# Patient Record
Sex: Female | Born: 1970 | Race: White | Hispanic: No | Marital: Married | Smoking: Never smoker
Health system: Southern US, Community
[De-identification: ages and names within clinical notes are randomized; demographics above are authoritative.]

## PROBLEM LIST (undated history)

## (undated) DIAGNOSIS — I10 Essential (primary) hypertension: Secondary | ICD-10-CM

## (undated) DIAGNOSIS — E079 Disorder of thyroid, unspecified: Secondary | ICD-10-CM

## (undated) HISTORY — PX: OTHER SURGICAL HISTORY: SHX169

---

## 1997-12-18 ENCOUNTER — Other Ambulatory Visit: Admission: RE | Admit: 1997-12-18 | Discharge: 1997-12-18 | Payer: Self-pay | Admitting: Obstetrics & Gynecology

## 1998-12-21 ENCOUNTER — Other Ambulatory Visit: Admission: RE | Admit: 1998-12-21 | Discharge: 1998-12-21 | Payer: Self-pay | Admitting: Obstetrics & Gynecology

## 2001-03-25 ENCOUNTER — Other Ambulatory Visit: Admission: RE | Admit: 2001-03-25 | Discharge: 2001-03-25 | Payer: Self-pay | Admitting: Obstetrics & Gynecology

## 2004-09-26 ENCOUNTER — Encounter: Admission: RE | Admit: 2004-09-26 | Discharge: 2004-09-26 | Payer: Self-pay | Admitting: Obstetrics & Gynecology

## 2004-10-17 ENCOUNTER — Other Ambulatory Visit: Admission: RE | Admit: 2004-10-17 | Discharge: 2004-10-17 | Payer: Self-pay | Admitting: Obstetrics & Gynecology

## 2009-07-09 ENCOUNTER — Encounter: Admission: RE | Admit: 2009-07-09 | Discharge: 2009-07-09 | Payer: Self-pay | Admitting: Obstetrics & Gynecology

## 2010-09-23 ENCOUNTER — Encounter
Admission: RE | Admit: 2010-09-23 | Discharge: 2010-09-23 | Payer: Self-pay | Source: Home / Self Care | Attending: Obstetrics & Gynecology | Admitting: Obstetrics & Gynecology

## 2011-09-06 ENCOUNTER — Other Ambulatory Visit: Payer: Self-pay | Admitting: Endocrinology

## 2011-09-06 DIAGNOSIS — E0590 Thyrotoxicosis, unspecified without thyrotoxic crisis or storm: Secondary | ICD-10-CM

## 2011-09-13 ENCOUNTER — Encounter (HOSPITAL_COMMUNITY)
Admission: RE | Admit: 2011-09-13 | Discharge: 2011-09-13 | Disposition: A | Discharge status: Home / Self Care | Payer: BC Managed Care – PPO | Source: Ambulatory Visit | Attending: Endocrinology | Admitting: Endocrinology

## 2011-09-13 DIAGNOSIS — E0590 Thyrotoxicosis, unspecified without thyrotoxic crisis or storm: Secondary | ICD-10-CM

## 2011-09-14 ENCOUNTER — Encounter (HOSPITAL_COMMUNITY)
Admission: RE | Admit: 2011-09-14 | Discharge: 2011-09-14 | Disposition: A | Discharge status: Home / Self Care | Payer: BC Managed Care – PPO | Source: Ambulatory Visit | Attending: Endocrinology | Admitting: Endocrinology

## 2011-09-14 DIAGNOSIS — R946 Abnormal results of thyroid function studies: Secondary | ICD-10-CM | POA: Insufficient documentation

## 2011-09-14 DIAGNOSIS — E0590 Thyrotoxicosis, unspecified without thyrotoxic crisis or storm: Secondary | ICD-10-CM | POA: Insufficient documentation

## 2011-09-14 MED ORDER — SODIUM IODIDE I 131 CAPSULE
10.2000 | Freq: Once | INTRAVENOUS | Status: AC | PRN
  Administered 2011-09-13: 10.2 via ORAL

## 2011-09-18 ENCOUNTER — Other Ambulatory Visit: Payer: Self-pay | Admitting: Endocrinology

## 2011-09-18 DIAGNOSIS — E0590 Thyrotoxicosis, unspecified without thyrotoxic crisis or storm: Secondary | ICD-10-CM

## 2011-09-28 ENCOUNTER — Encounter (HOSPITAL_COMMUNITY)
Admission: RE | Admit: 2011-09-28 | Discharge: 2011-09-28 | Disposition: A | Discharge status: Home / Self Care | Payer: BC Managed Care – PPO | Source: Ambulatory Visit | Attending: Endocrinology | Admitting: Endocrinology

## 2011-09-28 DIAGNOSIS — E0590 Thyrotoxicosis, unspecified without thyrotoxic crisis or storm: Secondary | ICD-10-CM | POA: Insufficient documentation

## 2011-09-28 LAB — HCG, SERUM, QUALITATIVE: Preg, Serum: NEGATIVE

## 2011-09-28 MED ORDER — SODIUM IODIDE I 131 CAPSULE
14.6700 | Freq: Once | INTRAVENOUS | Status: AC | PRN
  Administered 2011-09-28: 14.67 via ORAL

## 2012-09-02 ENCOUNTER — Other Ambulatory Visit: Payer: Self-pay | Admitting: Family Medicine

## 2012-09-02 DIAGNOSIS — Z1231 Encounter for screening mammogram for malignant neoplasm of breast: Secondary | ICD-10-CM

## 2012-09-25 ENCOUNTER — Observation Stay (HOSPITAL_COMMUNITY): Payer: BC Managed Care – PPO

## 2012-09-25 ENCOUNTER — Inpatient Hospital Stay (HOSPITAL_COMMUNITY)
Admission: EM | Admit: 2012-09-25 | Discharge: 2012-09-29 | DRG: 168 | Disposition: A | Discharge status: Home / Self Care | Payer: BC Managed Care – PPO | Attending: Internal Medicine | Admitting: Internal Medicine

## 2012-09-25 ENCOUNTER — Emergency Department (HOSPITAL_COMMUNITY): Payer: BC Managed Care – PPO

## 2012-09-25 ENCOUNTER — Encounter (HOSPITAL_COMMUNITY): Payer: Self-pay | Admitting: *Deleted

## 2012-09-25 ENCOUNTER — Ambulatory Visit: Payer: BC Managed Care – PPO

## 2012-09-25 DIAGNOSIS — L03211 Cellulitis of face: Secondary | ICD-10-CM | POA: Diagnosis present

## 2012-09-25 DIAGNOSIS — R259 Unspecified abnormal involuntary movements: Secondary | ICD-10-CM | POA: Diagnosis present

## 2012-09-25 DIAGNOSIS — E876 Hypokalemia: Secondary | ICD-10-CM | POA: Diagnosis not present

## 2012-09-25 DIAGNOSIS — L0201 Cutaneous abscess of face: Secondary | ICD-10-CM | POA: Diagnosis present

## 2012-09-25 DIAGNOSIS — E890 Postprocedural hypothyroidism: Secondary | ICD-10-CM | POA: Diagnosis present

## 2012-09-25 DIAGNOSIS — E162 Hypoglycemia, unspecified: Secondary | ICD-10-CM | POA: Diagnosis not present

## 2012-09-25 DIAGNOSIS — E039 Hypothyroidism, unspecified: Secondary | ICD-10-CM

## 2012-09-25 DIAGNOSIS — K047 Periapical abscess without sinus: Principal | ICD-10-CM | POA: Diagnosis present

## 2012-09-25 DIAGNOSIS — I10 Essential (primary) hypertension: Secondary | ICD-10-CM | POA: Diagnosis present

## 2012-09-25 DIAGNOSIS — D649 Anemia, unspecified: Secondary | ICD-10-CM | POA: Diagnosis not present

## 2012-09-25 HISTORY — DX: Essential (primary) hypertension: I10

## 2012-09-25 HISTORY — DX: Disorder of thyroid, unspecified: E07.9

## 2012-09-25 LAB — CBC WITH DIFFERENTIAL/PLATELET
Basophils Relative: 1 % (ref 0–1)
HCT: 38.5 % (ref 36.0–46.0)
Hemoglobin: 13.2 g/dL (ref 12.0–15.0)
Lymphocytes Relative: 19 % (ref 12–46)
Lymphs Abs: 2 10*3/uL (ref 0.7–4.0)
MCHC: 34.3 g/dL (ref 30.0–36.0)
Monocytes Absolute: 1 10*3/uL (ref 0.1–1.0)
Monocytes Relative: 9 % (ref 3–12)
Neutro Abs: 7.8 10*3/uL — ABNORMAL HIGH (ref 1.7–7.7)
Neutrophils Relative %: 71 % (ref 43–77)
RBC: 4.4 MIL/uL (ref 3.87–5.11)

## 2012-09-25 LAB — COMPREHENSIVE METABOLIC PANEL
Albumin: 4.3 g/dL (ref 3.5–5.2)
Alkaline Phosphatase: 97 U/L (ref 39–117)
BUN: 7 mg/dL (ref 6–23)
CO2: 28 mEq/L (ref 19–32)
Chloride: 98 mEq/L (ref 96–112)
Creatinine, Ser: 0.65 mg/dL (ref 0.50–1.10)
GFR calc Af Amer: >90 mL/min (ref >90)
GFR calc non Af Amer: >90 mL/min (ref >90)
Glucose, Bld: 99 mg/dL (ref 70–99)
Potassium: 3.4 mEq/L — ABNORMAL LOW (ref 3.5–5.1)
Total Bilirubin: 0.5 mg/dL (ref 0.3–1.2)

## 2012-09-25 LAB — PREGNANCY, URINE: Preg Test, Ur: NEGATIVE

## 2012-09-25 MED ORDER — HYDROMORPHONE HCL PF 1 MG/ML IJ SOLN
1.0000 mg | INTRAMUSCULAR | Status: DC | PRN
  Administered 2012-09-25 – 2012-09-29 (×18): 1 mg via INTRAVENOUS
  Filled 2012-09-25 (×18): qty 1

## 2012-09-25 MED ORDER — AMPICILLIN-SULBACTAM SODIUM 3 (2-1) G IJ SOLR
3.0000 g | Freq: Once | INTRAMUSCULAR | Status: AC
  Administered 2012-09-25: 3 g via INTRAVENOUS
  Filled 2012-09-25: qty 3

## 2012-09-25 MED ORDER — MORPHINE SULFATE 4 MG/ML IJ SOLN
4.0000 mg | Freq: Once | INTRAMUSCULAR | Status: AC
  Administered 2012-09-25: 4 mg via INTRAVENOUS
  Filled 2012-09-25: qty 1

## 2012-09-25 MED ORDER — SODIUM CHLORIDE 0.9 % IV SOLN
INTRAVENOUS | Status: DC
  Administered 2012-09-25: 23:00:00 via INTRAVENOUS

## 2012-09-25 MED ORDER — IOHEXOL 300 MG/ML  SOLN
75.0000 mL | Freq: Once | INTRAMUSCULAR | Status: AC | PRN
  Administered 2012-09-25: 75 mL via INTRAVENOUS

## 2012-09-25 MED ORDER — HYDROMORPHONE HCL PF 1 MG/ML IJ SOLN
1.0000 mg | Freq: Once | INTRAMUSCULAR | Status: AC
  Administered 2012-09-25: 1 mg via INTRAVENOUS
  Filled 2012-09-25: qty 1

## 2012-09-25 MED ORDER — SODIUM CHLORIDE 0.9 % IV SOLN
3.0000 g | Freq: Four times a day (QID) | INTRAVENOUS | Status: DC
  Filled 2012-09-25 (×2): qty 3

## 2012-09-25 MED ORDER — ONDANSETRON HCL 4 MG/2ML IJ SOLN: 4.0000 mg | Freq: Four times a day (QID) | INTRAMUSCULAR | Status: DC | PRN

## 2012-09-25 MED ORDER — NEBIVOLOL HCL 5 MG PO TABS
5.0000 mg | ORAL_TABLET | Freq: Every day | ORAL | Status: DC
  Administered 2012-09-26 – 2012-09-29 (×4): 5 mg via ORAL
  Filled 2012-09-25 (×4): qty 1

## 2012-09-25 MED ORDER — ONDANSETRON HCL 4 MG PO TABS: 4.0000 mg | ORAL_TABLET | Freq: Four times a day (QID) | ORAL | Status: DC | PRN

## 2012-09-25 MED ORDER — SODIUM CHLORIDE 0.9 % IV SOLN
3.0000 g | Freq: Four times a day (QID) | INTRAVENOUS | Status: DC
  Administered 2012-09-26 – 2012-09-29 (×14): 3 g via INTRAVENOUS
  Filled 2012-09-25 (×18): qty 3

## 2012-09-25 MED ORDER — LEVOTHYROXINE SODIUM 150 MCG PO TABS
150.0000 ug | ORAL_TABLET | Freq: Every day | ORAL | Status: DC
  Administered 2012-09-26 – 2012-09-29 (×4): 150 ug via ORAL
  Filled 2012-09-25 (×5): qty 1

## 2012-09-25 MED ORDER — VANCOMYCIN HCL IN DEXTROSE 1-5 GM/200ML-% IV SOLN: 1000.0000 mg | Freq: Once | INTRAVENOUS | Status: DC

## 2012-09-25 MED ORDER — ACETAMINOPHEN 650 MG RE SUPP: 650.0000 mg | Freq: Four times a day (QID) | RECTAL | Status: DC | PRN

## 2012-09-25 MED ORDER — SODIUM CHLORIDE 0.9 % IV BOLUS (SEPSIS)
1000.0000 mL | Freq: Once | INTRAVENOUS | Status: AC
  Administered 2012-09-25: 1000 mL via INTRAVENOUS

## 2012-09-25 MED ORDER — ACETAMINOPHEN 325 MG PO TABS
650.0000 mg | ORAL_TABLET | Freq: Four times a day (QID) | ORAL | Status: DC | PRN
  Administered 2012-09-26 – 2012-09-27 (×2): 650 mg via ORAL
  Filled 2012-09-25 (×2): qty 2

## 2012-09-25 MED ORDER — HYDRALAZINE HCL 20 MG/ML IJ SOLN
10.0000 mg | INTRAMUSCULAR | Status: DC | PRN
  Administered 2012-09-27: 10 mg via INTRAVENOUS
  Filled 2012-09-25 (×2): qty 0.5

## 2012-09-25 MED ORDER — IOHEXOL 300 MG/ML  SOLN: 75.0000 mL | Freq: Once | INTRAMUSCULAR | Status: DC | PRN

## 2012-09-25 NOTE — ED Notes (Signed)
Reports having right side root canal last tues and still having swelling to mouth and face, has taken clindamycin and amoxicillin with no relief. Airway intact.

## 2012-09-25 NOTE — ED Provider Notes (Signed)
History     CSN: [id number]  Arrival date & time 09/25/12  1503   First MD Initiated Contact with Patient 09/25/12 1728      Chief Complaint  Patient presents with  . Oral Swelling    (Consider location/radiation/quality/duration/timing/severity/associated sxs/prior treatment) HPI Comments: Pt comes in with cc oral swelling. Pt had root canal done 2 weeks ago, and has persistent swelling of her right maxilla. She had swelling even before the root canal. She haas finished a course of amoxi and clinda with no improvement. She saw her pcp, who sent her to the ER for further evaluation. She has trismus, and mild dysphagia. No fevers, but subjective chills. She is not immunocompromised.   The history is provided by the patient.    Past Medical History  Diagnosis Date  . Hypertension   . Thyroid disease     History reviewed. No pertinent past surgical history.  History reviewed. No pertinent family history.  History  Substance Use Topics  . Smoking status: Not on file  . Smokeless tobacco: Not on file  . Alcohol Use: Yes     Comment: occ wine    OB History    Grav Para Term Preterm Abortions TAB SAB Ect Mult Living                  Review of Systems  Constitutional: Positive for chills. Negative for fever and activity change.  HENT: Positive for trouble swallowing and dental problem. Negative for facial swelling and neck pain.   Respiratory: Negative for cough, shortness of breath and wheezing.   Cardiovascular: Negative for chest pain.  Gastrointestinal: Negative for nausea, vomiting, abdominal pain, diarrhea, constipation, blood in stool and abdominal distention.  Genitourinary: Negative for hematuria and difficulty urinating.  Skin: Negative for color change.  Neurological: Negative for speech difficulty.  Hematological: Does not bruise/bleed easily.  Psychiatric/Behavioral: Negative for confusion.    Allergies  Review of patient's allergies indicates no  known allergies.  Home Medications   Current Outpatient Rx  Name  Route  Sig  Dispense  Refill  . CLINDAMYCIN HCL 300 MG PO CAPS   Oral   Take 300 mg by mouth 4 (four) times daily. Started 1.20.14 for 7 days ending 1.27.14         . HYDROCODONE-ACETAMINOPHEN 5-325 MG PO TABS   Oral   Take 1-2 tablets by mouth 2 (two) times daily. 1 tab during the day and 2 at night to sleep         . LEVOTHYROXINE SODIUM 150 MCG PO TABS   Oral   Take 150 mcg by mouth daily.         Marland Kitchen NAPROXEN 250 MG PO TABS   Oral   Take 250 mg by mouth 2 (two) times daily with a meal.         . NEBIVOLOL HCL 5 MG PO TABS   Oral   Take 5 mg by mouth daily.           BP 150/68  Pulse 70  Temp 98.3 F (36.8 C) (Oral)  Resp 18  SpO2 98%  LMP 09/25/2012  Physical Exam  Nursing note and vitals reviewed. Constitutional: She is oriented to person, place, and time. She appears well-developed and well-nourished.  HENT:  Head: Normocephalic and atraumatic.       Pt has trismus. Pt has a 8-12 cm indurated lesion over the right maxilla and mandibular region, with tenderness and erythema, callor. The erythema  spreads down to the neck.  Eyes: EOM are normal. Pupils are equal, round, and reactive to light.  Neck: Neck supple.  Cardiovascular: Normal rate, regular rhythm and normal heart sounds.   No murmur heard. Pulmonary/Chest: Effort normal. No respiratory distress.  Abdominal: Soft. She exhibits no distension. There is no tenderness. There is no rebound and no guarding.  Neurological: She is alert and oriented to person, place, and time.  Skin: Skin is warm and dry.    ED Course  Procedures (including critical care time)  Labs Reviewed  CBC WITH DIFFERENTIAL - Abnormal; Notable for the following:    WBC 11.0 (*)     Neutro Abs 7.8 (*)     All other components within normal limits  COMPREHENSIVE METABOLIC PANEL - Abnormal; Notable for the following:    Potassium 3.4 (*)     All other  components within normal limits   No results found.   No diagnosis found.    MDM  Pt comes in with cc of oral swelling. She has trismus, has a large mass over the cheek, with erythema. Pt is protecting airway at this time. We will get CT max facial, as we suspected abscess.  We will get CT neck as well to make sure there is no airway concerns/deep infection of the neck.  Derwood Kaplan, MD 09/25/12 2012

## 2012-09-25 NOTE — ED Notes (Signed)
Patient transported to CT 

## 2012-09-25 NOTE — Progress Notes (Signed)
ANTIBIOTIC CONSULT NOTE - INITIAL  Pharmacy Consult for Unasyn Indication: mandibular and periapical abscess No Known Allergies Patient Measurements: Not available Vital Signs: Temp: 98.3 F (36.8 C) (01/22 1903) Temp src: Oral (01/22 1903) BP: 150/68 mmHg (01/22 1903) Pulse Rate: 70  (01/22 1903) Intake/Output from previous day:   Intake/Output from this shift:   Labs:  Basename 09/25/12 1752  WBC 11.0*  HGB 13.2  PLT 336  LABCREA --  CREATININE 0.65   CrCl is unknown because there is no height on file for the current visit. No results found for this basename: VANCOTROUGH:2,VANCOPEAK:2,VANCORANDOM:2,GENTTROUGH:2,GENTPEAK:2,GENTRANDOM:2,TOBRATROUGH:2,TOBRAPEAK:2,TOBRARND:2,AMIKACINPEAK:2,AMIKACINTROU:2,AMIKACIN:2, in the last 72 hours   Microbiology: No results found for this or any previous visit (from the past 720 hour(s)).  Medical History: Past Medical History  Diagnosis Date  . Hypertension   . Thyroid disease    Medications:  Anti-infectives     Start     Dose/Rate Route Frequency Ordered Stop   09/25/12 2045   vancomycin (VANCOCIN) IVPB 1000 mg/200 mL premix  Status:  Discontinued        1,000 mg 200 mL/hr over 60 Minutes Intravenous  Once 09/25/12 2035 09/25/12 2036   09/25/12 2015   Ampicillin-Sulbactam (UNASYN) 3 g in sodium chloride 0.9 % 100 mL IVPB        3 g 100 mL/hr over 60 Minutes Intravenous  Once 09/25/12 2010 09/25/12 2159         Assessment: 41 YOF admitted with mandibular and periapical abscess s/p 1 dose of Vancomycin 1g and 1 dose of Unasyn 3gm to continue on Unasyn per pharmacy. SCr 0.65 (wnl). WBC 11 (slightly elevated). Tmax 99.2. Blood cultures sent.   Goal of Therapy:  Clinical eradication of infection.   Plan:  1. Unasyn 3g IV q6h.  2. Follow-up renal function.   Fayne Norrie 09/25/2012,10:34 PM

## 2012-09-25 NOTE — H&P (Signed)
Glenda Cardenas is an 42 y.o. female.  Patient was seen and examined on September 25, 2012. PCP - Dr. Elias Else.  Chief Complaint: Right-sided mandibular pain.  HPI: 42 year-old female with history of hypertension and on thyroid replacement and after having and reactive iodine for hyperthyroidism presents with worsening pain and swelling in the right mandible area for the last one week. Her symptoms started last week had gone to a Designer, industrial/product who had done a root canal treatment. Despite which patient still had worsening pain and swelling. Today in the ER patient had CT done which shows right-sided perimandibular abscess with cellulitis and the origin is from possible from periapical abscess. On-call oral surgeon Dr. Jeanice Lim was consulted by the ER physician and surgeon will be seeing patient in consult. Patient has been started IV Unasyn. Patient has been having difficulty opening her mouth.  Past Medical History  Diagnosis Date  . Hypertension   . Thyroid disease     Past Surgical History  Procedure Date  . Radioactive iodine     Family History  Problem Relation Age of Onset  . CAD Father    Social History:  reports that she has never smoked. She does not have any smokeless tobacco history on file. She reports that she drinks alcohol. She reports that she does not use illicit drugs.  Allergies: No Known Allergies   (Not in a hospital admission)  Results for orders placed during the hospital encounter of 09/25/12 (from the past 48 hour(s))  CBC WITH DIFFERENTIAL     Status: Abnormal   Collection Time   09/25/12  5:52 PM      Component Value Range Comment   WBC 11.0 (*) 4.0 - 10.5 K/uL    RBC 4.40  3.87 - 5.11 MIL/uL    Hemoglobin 13.2  12.0 - 15.0 g/dL    HCT 19.1  47.8 - 29.5 %    MCV 87.5  78.0 - 100.0 fL    MCH 30.0  26.0 - 34.0 pg    MCHC 34.3  30.0 - 36.0 g/dL    RDW 62.1  30.8 - 65.7 %    Platelets 336  150 - 400 K/uL    Neutrophils Relative 71  43 - 77 %    Neutro Abs 7.8 (*) 1.7 - 7.7 K/uL    Lymphocytes Relative 19  12 - 46 %    Lymphs Abs 2.0  0.7 - 4.0 K/uL    Monocytes Relative 9  3 - 12 %    Monocytes Absolute 1.0  0.1 - 1.0 K/uL    Eosinophils Relative 2  0 - 5 %    Eosinophils Absolute 0.2  0.0 - 0.7 K/uL    Basophils Relative 1  0 - 1 %    Basophils Absolute 0.1  0.0 - 0.1 K/uL   COMPREHENSIVE METABOLIC PANEL     Status: Abnormal   Collection Time   09/25/12  5:52 PM      Component Value Range Comment   Sodium 138  135 - 145 mEq/L    Potassium 3.4 (*) 3.5 - 5.1 mEq/L    Chloride 98  96 - 112 mEq/L    CO2 28  19 - 32 mEq/L    Glucose, Bld 99  70 - 99 mg/dL    BUN 7  6 - 23 mg/dL    Creatinine, Ser 8.46  0.50 - 1.10 mg/dL    Calcium 96.2  8.4 - 10.5 mg/dL  Total Protein 7.8  6.0 - 8.3 g/dL    Albumin 4.3  3.5 - 5.2 g/dL    AST 16  0 - 37 U/L    ALT 10  0 - 35 U/L    Alkaline Phosphatase 97  39 - 117 U/L    Total Bilirubin 0.5  0.3 - 1.2 mg/dL    GFR calc non Af Amer >90  >90 mL/min    GFR calc Af Amer >90  >90 mL/min    Dg Orthopantogram  09/25/2012  *RADIOLOGY REPORT*  Clinical Data: Right lower dental abscess.  ORTHOPANTOGRAM/PANORAMIC  Comparison: CT of the neck on 09/25/2012  Findings: The right lower first molar has a root canal.  There is lucency below the tooth crown that is concerning for infection or abscess.  There is also periapical lucency around this tooth which was seen on the recent CT examination. The patient has additional dental hardware.  The third molars are present.  IMPRESSION: There is periapical lucency around the right lower first molar. This also lucency underneath the crown.  Findings are concerning for infection.   Original Report Authenticated By: Richarda Overlie, M.D.    Ct Soft Tissue Neck W Contrast  09/25/2012   *RADIOLOGY REPORT*  Clinical Data: Right facial swelling and difficulty swallowing. Root canal last Tuesday.  CT NECK WITH CONTRAST  Technique:  Multidetector CT imaging of the neck was  performed with intravenous contrast.  Contrast: 75mL OMNIPAQUE IOHEXOL 300 MG/ML  SOLN  Comparison: None.  Findings: The patient has a perimandibular abscess medial and lateral to the posterior right mandibular body extending toward the angle of the mandible.  This is adjacent to the roots of tooth number three.  There is a small defect in the cortex of the mandible medially at the anterior root of that tooth.  The patient has impacted bilateral mandibular and maxillary third molars.  There is soft tissue swelling of the overlying masseter muscle with soft tissue stranding in the cheek consistent with cellulitis. Reactive submandibular adenopathy on the right.  Right submandibular gland is normal.  Right parotid gland is normal.  The visualized portions of paranasal sinuses and mastoid air cells are normal.  The visualized portion of the brain is normal.  Orbits are normal.  No prevertebral soft tissue swelling.  Epiglottis is normal.  No enlargement of the adenoids.  Lingual tonsils are normal.  IMPRESSION: Right perimandibular abscess, probably originating from the periapical abscesses at the roots of tooth #3. The abscess measures approximately 2 x 3 cm.  Adjacent cellulitis, myositis and reactive adenopathy.   Original Report Authenticated By: Francene Boyers, M.D.     Review of Systems  Constitutional: Positive for fever and chills.  HENT:       Right mandibular pain and swelling.  Eyes: Negative.   Respiratory: Negative.   Cardiovascular: Negative.   Gastrointestinal: Negative.   Genitourinary: Negative.   Musculoskeletal: Negative.   Skin: Negative.   Neurological: Negative.   Endo/Heme/Allergies: Negative.   Psychiatric/Behavioral: Negative.     Blood pressure 150/68, pulse 70, temperature 98.3 F (36.8 C), temperature source Oral, resp. rate 18, last menstrual period 09/25/2012, SpO2 98.00%. Physical Exam  Constitutional: She is oriented to person, place, and time. She appears  well-developed and well-nourished. No distress.  HENT:       Right mandibular swelling with difficulty opening mouth.  Eyes: Conjunctivae normal are normal. Pupils are equal, round, and reactive to light. Right eye exhibits no discharge. Left eye exhibits  no discharge. No scleral icterus.  Neck: Normal range of motion. Neck supple.  Cardiovascular: Normal rate and regular rhythm.   Respiratory: Effort normal and breath sounds normal. No respiratory distress. She has no wheezes. She has no rales.  GI: Soft. Bowel sounds are normal. She exhibits no distension. There is no tenderness. There is no rebound.  Musculoskeletal: She exhibits no edema and no tenderness.  Neurological: She is alert and oriented to person, place, and time.       Moves all extremities.  Skin: Skin is warm. She is not diaphoretic.     Assessment/Plan #1. Right-sided perimandibular abscess with cellulitis and periapical abscess - continue with IV Unasyn. Keep patient n.p.o. In anticipation of possible surgery. Continue IV fluids and pain medications. #2. Hypertension - in addition to home medication keep patient on when necessary IV hydralazine for systolic blood pressure more than 160. #3. On replacement Synthroid - continue Synthroid.  CODE STATUS - full code.  Eduard Clos 09/25/2012, 9:53 PM

## 2012-09-25 NOTE — ED Provider Notes (Signed)
  Physical Exam  BP 150/68  Pulse 70  Temp 98.3 F (36.8 C) (Oral)  Resp 18  SpO2 98%  LMP 09/25/2012  Physical Exam To be admitted for facial abscess  ED Course  Procedures Dr. Shyrl Numbers spoke with Dr. Jeanice Lim who will consult in AM  MDM Admitted by hospitalist service to med       Arman Filter, NP 09/25/12 2044

## 2012-09-26 ENCOUNTER — Encounter (HOSPITAL_COMMUNITY): Payer: Self-pay | Admitting: General Practice

## 2012-09-26 DIAGNOSIS — D649 Anemia, unspecified: Secondary | ICD-10-CM | POA: Diagnosis not present

## 2012-09-26 DIAGNOSIS — E162 Hypoglycemia, unspecified: Secondary | ICD-10-CM

## 2012-09-26 DIAGNOSIS — L0201 Cutaneous abscess of face: Secondary | ICD-10-CM

## 2012-09-26 DIAGNOSIS — E876 Hypokalemia: Secondary | ICD-10-CM

## 2012-09-26 LAB — GLUCOSE, CAPILLARY
Glucose-Capillary: 101 mg/dL — ABNORMAL HIGH (ref 70–99)
Glucose-Capillary: 64 mg/dL — ABNORMAL LOW (ref 70–99)
Glucose-Capillary: 73 mg/dL (ref 70–99)
Glucose-Capillary: 93 mg/dL (ref 70–99)
Glucose-Capillary: 99 mg/dL (ref 70–99)

## 2012-09-26 LAB — CBC
MCH: 29.9 pg (ref 26.0–34.0)
MCHC: 33.7 g/dL (ref 30.0–36.0)
Platelets: 286 10*3/uL (ref 150–400)
RBC: 3.91 MIL/uL (ref 3.87–5.11)

## 2012-09-26 LAB — BASIC METABOLIC PANEL
Calcium: 8.3 mg/dL — ABNORMAL LOW (ref 8.4–10.5)
GFR calc Af Amer: >90 mL/min (ref >90)
GFR calc non Af Amer: >90 mL/min (ref >90)
Glucose, Bld: 87 mg/dL (ref 70–99)
Potassium: 3.5 mEq/L (ref 3.5–5.1)
Sodium: 138 mEq/L (ref 135–145)

## 2012-09-26 MED ORDER — DEXTROSE 50 % IV SOLN
12.5000 g | Freq: Once | INTRAVENOUS | Status: AC
  Administered 2012-09-26: 12.5 g via INTRAVENOUS
  Filled 2012-09-26: qty 50

## 2012-09-26 MED ORDER — KCL IN DEXTROSE-NACL 20-5-0.9 MEQ/L-%-% IV SOLN
INTRAVENOUS | Status: DC
  Administered 2012-09-26 – 2012-09-28 (×3): via INTRAVENOUS
  Filled 2012-09-26 (×5): qty 1000

## 2012-09-26 NOTE — Progress Notes (Signed)
[facility] PROGRESS NOTE  Glenda Cardenas:[id number] DOB: 12/27/1970 DOA: 09/25/2012 PCP: Lolita Patella, MD  Brief narrative 42 year old female with history of HTN, hypothyroidism following RAI, admitted on 1/22 with one week of worsening pain and swelling in the right lower jaw. Seen by dental surgeon OP and root canal it meant done but symptoms worsened despite that. In the ED, CT showed right-sided perimandibular abscess with cellulitis possibly from periapical abscess associated with tooth #30.  Assessment/Plan:  Carious tooth #30 (root canal treated) with periapical abscess and right submasseteric, submandibular, and pterygomandibular space infection. Dr. Mauri Reading surgeons input appreciated. Plans for OR for extraoral and intraoral I&D and removal of #30-late tonight or on 1/24. Currently n.p.o. per surgery. Pain adequately controlled. Continue IV Unasyn.  Hypoglycemia Not a known diabetic. Currently n.p.o. Treat with IV dextrose and monitor CBGs closely.  Hypokalemia Repleted. Follow BMP  Anemia ? Dilutional. Follow CBC in a.m.  HTN Controlled. Continue BB.  Hypothyroid, S/P RAI Continue Synthroid.  Code Status: Full Family Communication: Discussed with spouse at bedside. Disposition Plan: Home when medically stable.   Consultants:  Dr. Peggyann Shoals surgeon  Procedures:  None  Antibiotics:  IV Unasyn 1/22 >   HPI/Subjective: Right lower jaw pain-2/10 after pain medications. Unable to open mouth secondary to pain and swelling  Objective: Filed Vitals:   09/25/12 1655 09/25/12 1903 09/25/12 2230 09/26/12 0537  BP: 161/77 150/68 151/90 130/70  Pulse: 66 70 76 80  Temp: 99 F (37.2 C) 98.3 F (36.8 C) 99.4 F (37.4 C) 100.3 F (37.9 C)  TempSrc: Oral Oral Oral Oral  Resp: 16 18 16 16   SpO2: 99% 98% 99% 97%    Intake/Output Summary (Last 24 hours) at 09/26/12 1237 Last data filed at 09/26/12 8119  Gross per 24  hour  Intake    650 ml  Output      2 ml  Net    648 ml   There were no vitals filed for this visit.  Exam:   General exam: Pleasant and comfortable.  Respiratory system: Clear to auscultation. No increased work of breathing.  Cardiovascular system: First and second heart sounds heard, regular rate and rhythm. No JVD, murmurs or pedal edema.  Gastrointestinal system: Abdomen is nondistended, soft and nontender. Normal bowel sounds heard.  Central nervous system: Alert and oriented. No focal deficits.  HEENT, neck: Right mid mandibular swelling-hard, smooth surface, mildly tender and warm , nonpulsatile and no fluctuation. Measures approximately 3-4 cm diameter. Unable to open mouth beyond approximately 2 cm  Data Reviewed: Basic Metabolic Panel:  Lab 09/26/12 1478 09/25/12 1752  NA 138 138  K 3.5 3.4*  CL 102 98  CO2 25 28  GLUCOSE 87 99  BUN 7 7  CREATININE 0.63 0.65  CALCIUM 8.3* 10.2  MG -- --  PHOS -- --   Liver Function Tests:  Lab 09/25/12 1752  AST 16  ALT 10  ALKPHOS 97  BILITOT 0.5  PROT 7.8  ALBUMIN 4.3   No results found for this basename: LIPASE:5,AMYLASE:5 in the last 168 hours No results found for this basename: AMMONIA:5 in the last 168 hours CBC:  Lab 09/26/12 0630 09/25/12 1752  WBC 9.1 11.0*  NEUTROABS -- 7.8*  HGB 11.7* 13.2  HCT 34.7* 38.5  MCV 88.7 87.5  PLT 286 336   Cardiac Enzymes: No results found for this basename: CKTOTAL:5,CKMB:5,CKMBINDEX:5,TROPONINI:5 in the last 168 hours BNP (last 3 results) No results found for this basename: PROBNP:3 in  the last 8760 hours CBG:  Lab 09/26/12 1207 09/26/12 0537 09/25/12 2359  GLUCAP 64* 81 93    No results found for this or any previous visit (from the past 240 hour(s)).   Studies: Dg Orthopantogram  09/25/2012  *RADIOLOGY REPORT*  Clinical Data: Right lower dental abscess.  ORTHOPANTOGRAM/PANORAMIC  Comparison: CT of the neck on 09/25/2012  Findings: The right lower first molar  has a root canal.  There is lucency below the tooth crown that is concerning for infection or abscess.  There is also periapical lucency around this tooth which was seen on the recent CT examination. The patient has additional dental hardware.  The third molars are present.  IMPRESSION: There is periapical lucency around the right lower first molar. This also lucency underneath the crown.  Findings are concerning for infection.   Original Report Authenticated By: Richarda Overlie, M.D.    Ct Soft Tissue Neck W Contrast  09/25/2012   *RADIOLOGY REPORT*  Clinical Data: Right facial swelling and difficulty swallowing. Root canal last Tuesday.  CT NECK WITH CONTRAST  Technique:  Multidetector CT imaging of the neck was performed with intravenous contrast.  Contrast: 75mL OMNIPAQUE IOHEXOL 300 MG/ML  SOLN  Comparison: None.  Findings: The patient has a perimandibular abscess medial and lateral to the posterior right mandibular body extending toward the angle of the mandible.  This is adjacent to the roots of tooth number three.  There is a small defect in the cortex of the mandible medially at the anterior root of that tooth.  The patient has impacted bilateral mandibular and maxillary third molars.  There is soft tissue swelling of the overlying masseter muscle with soft tissue stranding in the cheek consistent with cellulitis. Reactive submandibular adenopathy on the right.  Right submandibular gland is normal.  Right parotid gland is normal.  The visualized portions of paranasal sinuses and mastoid air cells are normal.  The visualized portion of the brain is normal.  Orbits are normal.  No prevertebral soft tissue swelling.  Epiglottis is normal.  No enlargement of the adenoids.  Lingual tonsils are normal.  IMPRESSION: Right perimandibular abscess, probably originating from the periapical abscesses at the roots of tooth #3. The abscess measures approximately 2 x 3 cm.  Adjacent cellulitis, myositis and reactive  adenopathy.   Original Report Authenticated By: Francene Boyers, M.D.     Scheduled Meds:    . ampicillin-sulbactam (UNASYN) IV  3 g Intravenous Q6H  . levothyroxine  150 mcg Oral QAC breakfast  . nebivolol  5 mg Oral Daily   Continuous Infusions:    . dextrose 5 % and 0.9 % NaCl with KCl 20 mEq/L      Principal Problem:  *Periapical abscess Active Problems:  Hypothyroidism  HTN (hypertension)    Time spent: 35 minutes    [facility]  [facility] Pager [phone]. If 8PM-8AM, please contact night-coverage at [email or web address], password [facility], [place] 09/26/2012, 12:37 PM  LOS: 1 day

## 2012-09-26 NOTE — Consult Note (Signed)
Oral & Maxillofacial Surgery - Consult Note:  Reason for Consult:Right facial swelling/abscess Referring Physician: Dr. Kelvin Cardenas is an 42 y.o. female.  HPI: 42 year-old female that presents with worsening pain and swelling in the right mandible area for the last one week. Her symptoms started last week had gone to a local Endodontist who performed a root canal treatment. Even after the root canal she still had worsening pain and swelling.  She has failed Amoxicillin and Clindamycin.  She went to her PCP and was sent to the ER.     The patient went to [facility] ER andt had CT scan performed which showed right-sided perimandibular abscess with cellulitis and the origin is from possible from periapical abscess associated with #30. The patient was admitted to the Internal Medicine service and has been started on IV Unasyn. Patient has trismus.   PMHx:  Past Medical History  Diagnosis Date  . Hypertension   . Thyroid disease     PSx:  Past Surgical History  Procedure Date  . Radioactive iodine     Family Hx:  Family History  Problem Relation Age of Onset  . CAD Father     Social Hx:  reports that she has never smoked. She does not have any smokeless tobacco history on file. She reports that she drinks alcohol. She reports that she does not use illicit drugs.  Allergies: No Known Allergies  Medications: I have reviewed the patient's current medications.  Labs:  Results for orders placed during the hospital encounter of 09/25/12 (from the past 48 hour(s))  CBC WITH DIFFERENTIAL     Status: Abnormal   Collection Time   09/25/12  5:52 PM      Component Value Range Comment   WBC 11.0 (*) 4.0 - 10.5 K/uL    RBC 4.40  3.87 - 5.11 MIL/uL    Hemoglobin 13.2  12.0 - 15.0 g/dL    HCT 29.5  62.1 - 30.8 %    MCV 87.5  78.0 - 100.0 fL    MCH 30.0  26.0 - 34.0 pg    MCHC 34.3  30.0 - 36.0 g/dL    RDW 65.7  84.6 - 96.2 %    Platelets 336  150 - 400 K/uL    Neutrophils  Relative 71  43 - 77 %    Neutro Abs 7.8 (*) 1.7 - 7.7 K/uL    Lymphocytes Relative 19  12 - 46 %    Lymphs Abs 2.0  0.7 - 4.0 K/uL    Monocytes Relative 9  3 - 12 %    Monocytes Absolute 1.0  0.1 - 1.0 K/uL    Eosinophils Relative 2  0 - 5 %    Eosinophils Absolute 0.2  0.0 - 0.7 K/uL    Basophils Relative 1  0 - 1 %    Basophils Absolute 0.1  0.0 - 0.1 K/uL   COMPREHENSIVE METABOLIC PANEL     Status: Abnormal   Collection Time   09/25/12  5:52 PM      Component Value Range Comment   Sodium 138  135 - 145 mEq/Cardenas    Potassium 3.4 (*) 3.5 - 5.1 mEq/Cardenas    Chloride 98  96 - 112 mEq/Cardenas    CO2 28  19 - 32 mEq/Cardenas    Glucose, Bld 99  70 - 99 mg/dL    BUN 7  6 - 23 mg/dL    Creatinine, Ser 9.52  0.50 -  1.10 mg/dL    Calcium 04.5  8.4 - 10.5 mg/dL    Total Protein 7.8  6.0 - 8.3 g/dL    Albumin 4.3  3.5 - 5.2 g/dL    AST 16  0 - 37 U/Cardenas    ALT 10  0 - 35 U/Cardenas    Alkaline Phosphatase 97  39 - 117 U/Cardenas    Total Bilirubin 0.5  0.3 - 1.2 mg/dL    GFR calc non Af Amer >90  >90 mL/min    GFR calc Af Amer >90  >90 mL/min   PREGNANCY, URINE     Status: Normal   Collection Time   09/25/12 11:32 PM      Component Value Range Comment   Preg Test, Ur NEGATIVE  NEGATIVE   GLUCOSE, CAPILLARY     Status: Normal   Collection Time   09/25/12 11:59 PM      Component Value Range Comment   Glucose-Capillary 93  70 - 99 mg/dL   GLUCOSE, CAPILLARY     Status: Normal   Collection Time   09/26/12  5:37 AM      Component Value Range Comment   Glucose-Capillary 81  70 - 99 mg/dL   CBC     Status: Abnormal   Collection Time   09/26/12  6:30 AM      Component Value Range Comment   WBC 9.1  4.0 - 10.5 K/uL    RBC 3.91  3.87 - 5.11 MIL/uL    Hemoglobin 11.7 (*) 12.0 - 15.0 g/dL    HCT 40.9 (*) 81.1 - 46.0 %    MCV 88.7  78.0 - 100.0 fL    MCH 29.9  26.0 - 34.0 pg    MCHC 33.7  30.0 - 36.0 g/dL    RDW 91.4  78.2 - 95.6 %    Platelets 286  150 - 400 K/uL     Radiology: Dg Orthopantogram  09/25/2012   *RADIOLOGY REPORT*  Clinical Data: Right lower dental abscess.  ORTHOPANTOGRAM/PANORAMIC  Comparison: CT of the neck on 09/25/2012  Findings: The right lower first molar has a root canal.  There is lucency below the tooth crown that is concerning for infection or abscess.  There is also periapical lucency around this tooth which was seen on the recent CT examination. The patient has additional dental hardware.  The third molars are present.  IMPRESSION: There is periapical lucency around the right lower first molar. This also lucency underneath the crown.  Findings are concerning for infection.   Original Report Authenticated By: Glenda Cardenas, M.D.    Ct Soft Tissue Neck W Contrast  09/25/2012   *RADIOLOGY REPORT*  Clinical Data: Right facial swelling and difficulty swallowing. Root canal last Tuesday.  CT NECK WITH CONTRAST  Technique:  Multidetector CT imaging of the neck was performed with intravenous contrast.  Contrast: 75mL OMNIPAQUE IOHEXOL 300 MG/ML  SOLN  Comparison: None.  Findings: The patient has a perimandibular abscess medial and lateral to the posterior right mandibular body extending toward the angle of the mandible.  This is adjacent to the roots of tooth number three.  There is a small defect in the cortex of the mandible medially at the anterior root of that tooth.  The patient has impacted bilateral mandibular and maxillary third molars.  There is soft tissue swelling of the overlying masseter muscle with soft tissue stranding in the cheek consistent with cellulitis. Reactive submandibular adenopathy on the right.  Right submandibular gland  is normal.  Right parotid gland is normal.  The visualized portions of paranasal sinuses and mastoid air cells are normal.  The visualized portion of the brain is normal.  Orbits are normal.  No prevertebral soft tissue swelling.  Epiglottis is normal.  No enlargement of the adenoids.  Lingual tonsils are normal.  IMPRESSION: Right perimandibular abscess,  probably originating from the periapical abscesses at the roots of tooth #3. The abscess measures approximately 2 x 3 cm.  Adjacent cellulitis, myositis and reactive adenopathy.   Original Report Authenticated By: Francene Boyers, M.D.    Carious tooth #30 (root canal treated) with periapical abscess and right submasseteric, submandibular, and pterygomandibular space infection.  RUE:AVWU, nose, mouth, throat, and face: positive for sore mouth and sore throat  Vital Signs: BP 130/70  Pulse 80  Temp 100.3 F (37.9 C) (Oral)  Resp 16  SpO2 97%  LMP 09/25/2012  Physical Exam: General appearance: alert and cooperative Head: Normocephalic, without obvious abnormality, atraumatic Throat: abnormal findings: moderate oropharyngeal erythema Neck: marked anterior cervical adenopathy and Right mandibular swelling and loss of the inferior border of the mandible. The patient has severe trismus (opening 20 mm) but is able to protrude her tongue.  Cannot assess airway clinically.    Assessment/Plan: The patient has a carious tooth #30 (root canal treated) with periapical abscess and right submasseteric, submandibular, and pterygomandibular space infection. Plan:  1. Taking patient to OR for Extraoral and intraoral I&D and surgical  Removal of #30. 2. Will not be able to perform surgery until 9 pm tonight due to having other cases, will possibly have to take to OR tomorrow after 12pm.   3. Patient can have clear liquids up to 8 hours prior to surgery.    [place],Glenda Cardenas  09/26/2012, 7:21 AM

## 2012-09-27 ENCOUNTER — Encounter (HOSPITAL_COMMUNITY)
Admission: EM | Disposition: A | Discharge status: Home / Self Care | Payer: Self-pay | Source: Home / Self Care | Attending: Internal Medicine

## 2012-09-27 ENCOUNTER — Encounter (HOSPITAL_COMMUNITY): Payer: Self-pay | Admitting: Anesthesiology

## 2012-09-27 ENCOUNTER — Inpatient Hospital Stay (HOSPITAL_COMMUNITY): Payer: BC Managed Care – PPO | Admitting: Anesthesiology

## 2012-09-27 HISTORY — PX: TOOTH EXTRACTION: SHX859

## 2012-09-27 LAB — SURGICAL PCR SCREEN: MRSA, PCR: NEGATIVE

## 2012-09-27 LAB — BASIC METABOLIC PANEL
BUN: 3 mg/dL — ABNORMAL LOW (ref 6–23)
Chloride: 103 mEq/L (ref 96–112)
GFR calc Af Amer: >90 mL/min (ref >90)
GFR calc non Af Amer: >90 mL/min (ref >90)
Potassium: 3.6 mEq/L (ref 3.5–5.1)
Sodium: 139 mEq/L (ref 135–145)

## 2012-09-27 LAB — CBC
HCT: 31.9 % — ABNORMAL LOW (ref 36.0–46.0)
MCHC: 32.9 g/dL (ref 30.0–36.0)
RDW: 12.5 % (ref 11.5–15.5)
WBC: 8.3 10*3/uL (ref 4.0–10.5)

## 2012-09-27 LAB — GLUCOSE, CAPILLARY
Glucose-Capillary: 123 mg/dL — ABNORMAL HIGH (ref 70–99)
Glucose-Capillary: 96 mg/dL (ref 70–99)

## 2012-09-27 SURGERY — EXTRACTION, TOOTH, MOLAR
Anesthesia: General | Site: Mouth | Laterality: Right | Wound class: Dirty or Infected

## 2012-09-27 MED ORDER — ONDANSETRON HCL 4 MG/2ML IJ SOLN
INTRAMUSCULAR | Status: DC | PRN
  Administered 2012-09-27: 4 mg via INTRAVENOUS

## 2012-09-27 MED ORDER — MIDAZOLAM HCL 5 MG/5ML IJ SOLN
INTRAMUSCULAR | Status: DC | PRN
  Administered 2012-09-27: 0.5 mg via INTRAVENOUS

## 2012-09-27 MED ORDER — SODIUM CHLORIDE 0.9 % IR SOLN
Status: DC | PRN
  Administered 2012-09-27: 1

## 2012-09-27 MED ORDER — LIDOCAINE-EPINEPHRINE 1 %-1:100000 IJ SOLN
INTRAMUSCULAR | Status: DC | PRN
  Administered 2012-09-27: 20 mL via INTRADERMAL

## 2012-09-27 MED ORDER — ROCURONIUM BROMIDE 100 MG/10ML IV SOLN
INTRAVENOUS | Status: DC | PRN
  Administered 2012-09-27: 25 mg via INTRAVENOUS

## 2012-09-27 MED ORDER — HYDROMORPHONE HCL PF 1 MG/ML IJ SOLN
0.2500 mg | INTRAMUSCULAR | Status: DC | PRN
  Administered 2012-09-27: 0.5 mg via INTRAVENOUS
  Administered 2012-09-27: 0.25 mg via INTRAVENOUS
  Administered 2012-09-27 (×2): 0.5 mg via INTRAVENOUS
  Administered 2012-09-27: 0.25 mg via INTRAVENOUS

## 2012-09-27 MED ORDER — NEOSTIGMINE METHYLSULFATE 1 MG/ML IJ SOLN
INTRAMUSCULAR | Status: DC | PRN
  Administered 2012-09-27: 3 mg via INTRAVENOUS

## 2012-09-27 MED ORDER — LACTATED RINGERS IV SOLN
INTRAVENOUS | Status: DC | PRN
  Administered 2012-09-27 (×2): via INTRAVENOUS

## 2012-09-27 MED ORDER — GLYCOPYRROLATE 0.2 MG/ML IJ SOLN
INTRAMUSCULAR | Status: DC | PRN
  Administered 2012-09-27: 0.4 mg via INTRAVENOUS

## 2012-09-27 MED ORDER — 0.9 % SODIUM CHLORIDE (POUR BTL) OPTIME
TOPICAL | Status: DC | PRN
  Administered 2012-09-27: 1000 mL

## 2012-09-27 MED ORDER — LIDOCAINE HCL (CARDIAC) 20 MG/ML IV SOLN
INTRAVENOUS | Status: DC | PRN
  Administered 2012-09-27: 70 mg via INTRAVENOUS

## 2012-09-27 MED ORDER — FENTANYL CITRATE 0.05 MG/ML IJ SOLN
INTRAMUSCULAR | Status: DC | PRN
  Administered 2012-09-27: 75 ug via INTRAVENOUS
  Administered 2012-09-27 (×3): 25 ug via INTRAVENOUS

## 2012-09-27 MED ORDER — PROPOFOL 10 MG/ML IV BOLUS
INTRAVENOUS | Status: DC | PRN
  Administered 2012-09-27: 200 mg via INTRAVENOUS
  Administered 2012-09-27: 30 mg via INTRAVENOUS

## 2012-09-27 MED ORDER — BUPIVACAINE-EPINEPHRINE 0.5% -1:200000 IJ SOLN
INTRAMUSCULAR | Status: DC | PRN
  Administered 2012-09-27: 30 mL

## 2012-09-27 MED ORDER — ONDANSETRON HCL 4 MG/2ML IJ SOLN: 4.0000 mg | Freq: Once | INTRAMUSCULAR | Status: DC | PRN

## 2012-09-27 SURGICAL SUPPLY — 57 items
ALCOHOL ISOPROPYL (RUBBING) (MISCELLANEOUS) ×2 IMPLANT
ATTRACTOMAT 16X20 MAGNETIC DRP (DRAPES) IMPLANT
BUR CROSS CUT (BURR)
BUR CROSS CUT FISSURE 1.6 (BURR) ×1 IMPLANT
BUR RND FLUTED 2.5 (BURR) IMPLANT
BUR SRG MED 1.2XXCUT FSSR (BURR) IMPLANT
BUR SRG MED 1.6XXCUT FSSR (BURR) IMPLANT
BUR SRG MED 2.1XXCUT FSSR (BURR) IMPLANT
BUR STRYKR 2.5 FLUT MED (BURR) IMPLANT
BURR SRG MED 1.2XXCUT FSSR (BURR)
BURR SRG MED 1.6XXCUT FSSR (BURR)
BURR SRG MED 2.1XXCUT FSSR (BURR)
CANISTER SUCTION 2500CC (MISCELLANEOUS) ×2 IMPLANT
CLEANER TIP ELECTROSURG 2X2 (MISCELLANEOUS) IMPLANT
CLOTH BEACON ORANGE TIMEOUT ST (SAFETY) ×2 IMPLANT
CONT SPEC STER OR (MISCELLANEOUS) IMPLANT
COVER SURGICAL LIGHT HANDLE (MISCELLANEOUS) ×2 IMPLANT
DRAIN PENROSE 1/4X12 LTX STRL (WOUND CARE) ×1 IMPLANT
DRAPE PROXIMA HALF (DRAPES) ×1 IMPLANT
DRESSING ADAPTIC 1/2  N-ADH (PACKING) ×1 IMPLANT
DRSG MEPILEX BORDER 4X4 (GAUZE/BANDAGES/DRESSINGS) ×1 IMPLANT
ELECT COATED BLADE 2.86 ST (ELECTRODE) IMPLANT
ELECT REM PT RETURN 9FT ADLT (ELECTROSURGICAL)
ELECTRODE REM PT RTRN 9FT ADLT (ELECTROSURGICAL) IMPLANT
GAUZE PACKING FOLDED 2  STR (GAUZE/BANDAGES/DRESSINGS) ×1
GAUZE PACKING FOLDED 2 STR (GAUZE/BANDAGES/DRESSINGS) ×1 IMPLANT
GLOVE BIO SURGEON STRL SZ 6.5 (GLOVE) ×1 IMPLANT
GLOVE BIO SURGEON STRL SZ7.5 (GLOVE) IMPLANT
GLOVE BIOGEL PI IND STRL 6.5 (GLOVE) ×1 IMPLANT
GLOVE BIOGEL PI IND STRL 7.0 (GLOVE) IMPLANT
GLOVE BIOGEL PI IND STRL 7.5 (GLOVE) ×1 IMPLANT
GLOVE BIOGEL PI INDICATOR 6.5 (GLOVE) ×1
GLOVE BIOGEL PI INDICATOR 7.0 (GLOVE) ×1
GLOVE BIOGEL PI INDICATOR 7.5 (GLOVE) ×1
GLOVE ORTHO TXT STRL SZ7.5 (GLOVE) ×2 IMPLANT
GOWN STRL NON-REIN LRG LVL3 (GOWN DISPOSABLE) ×5 IMPLANT
KIT BASIN OR (CUSTOM PROCEDURE TRAY) ×2 IMPLANT
KIT ROOM TURNOVER OR (KITS) ×2 IMPLANT
NDL BLUNT 16X1.5 OR ONLY (NEEDLE) ×1 IMPLANT
NDL DENTAL 27 LONG (NEEDLE) ×2 IMPLANT
NEEDLE 22X1 1/2 (OR ONLY) (NEEDLE) ×1 IMPLANT
NEEDLE BLUNT 16X1.5 OR ONLY (NEEDLE) ×2 IMPLANT
NEEDLE DENTAL 27 LONG (NEEDLE) ×4 IMPLANT
NS IRRIG 1000ML POUR BTL (IV SOLUTION) ×2 IMPLANT
PAD ARMBOARD 7.5X6 YLW CONV (MISCELLANEOUS) ×4 IMPLANT
PENCIL BUTTON HOLSTER BLD 10FT (ELECTRODE) IMPLANT
SOLUTION BETADINE 4OZ (MISCELLANEOUS) IMPLANT
SPONGE GAUZE 4X4 12PLY (GAUZE/BANDAGES/DRESSINGS) IMPLANT
SUT CHROMIC 3 0 PS 2 (SUTURE) ×2 IMPLANT
SUT SILK 2 0 SH (SUTURE) ×1 IMPLANT
SYR 50ML SLIP (SYRINGE) ×2 IMPLANT
SYR CONTROL 10ML LL (SYRINGE) ×1 IMPLANT
TOOTHBRUSH ADULT (PERSONAL CARE ITEMS) ×2 IMPLANT
TOWEL OR 17X24 6PK STRL BLUE (TOWEL DISPOSABLE) ×2 IMPLANT
TOWEL OR 17X26 10 PK STRL BLUE (TOWEL DISPOSABLE) ×2 IMPLANT
TRAY ENT MC OR (CUSTOM PROCEDURE TRAY) ×2 IMPLANT
WATER STERILE IRR 1000ML POUR (IV SOLUTION) ×1 IMPLANT

## 2012-09-27 NOTE — Op Note (Signed)
09/25/2012 - 09/27/2012  4:34 PM  PATIENT:  [name]  42 y.o. female  PRE-OPERATIVE DIAGNOSIS:  FACIAL ABSCESS   POST-OPERATIVE DIAGNOSIS:  FACIAL ABSCESS   PROCEDURE:  Procedure(s): EXTRACTION MOLARS  INDICATIONS FOR PROCEDURE:  The patient is a 42 year-old female that presented with worsening pain and swelling in the right mandible area for the last one week. Her symptoms started last week.  She initially went to a local Endodontist who performed a root canal treatment on #30. Even after the root canal she still had worsening pain and swelling. She also failed Amoxicillin and Clindamycin. She then went to her PCP and was sent to the ER. The patient went to [facility] ER and had CT scan performed which showed right-sided perimandibular abscess (Sub masseteric, submandibular, and pterygomandibular spaces) with cellulitis which periapical abscess associated with #30. The patient was admitted to the Internal Medicine service and has been started on IV Unasyn. She was taken today to the OR for I&D of the involved right facial spaces and extraction of #30.  SURGEON:  Surgeon(s): Francene Finders, DDS  PHYSICIAN ASSISTANT: none  ASSISTANTS: none   PROCEDURE IN DETAIL: The patient was seen and evaluated in the pre-operative holding area.  Her history and physical was updated, her consent was signed, and the patient was marked.  The anesthesia service took the patient to the OR and placed her on the table in a supine position.  She was nasally intubated.  She was prepared and draped as usual for Oral and Maxillofacial Surgery procedures.  A moistened Raytec was placed into the oropharynx as a throat pack.  Local anesthetic was infiltrated into the right mandible (extraoral and intraoral).  Next, a 15 blade was used to make a 2 cm incision 2 cm interior to the right mandible just anterior to the anti-gonial notch.  This incision was carried just down into subcutaneous tissue.  Next, a  hemostat was used to bluntly dissect down to the inferior border.  There was an immediate release of 5 to 10 mL of purulent matter.  Next a Kelly hemostat was used to dissect along the lateral mandible into the submasseteric space and the medial into the submandibular and superiorly into the pterygomandibular spaces to yield 10 more mL of purulence.  Copious irrigation was used throughout, over 100 mL of saline was used to cleanse the wound.  Next, our attention was turned intraorally.  A mouth prop was used to open the mouth.  A 15 blade was used to make a full thickness mucoperiosteal flap around #30.  Then, an elevator and forceps was used to attempt the extraction of #30; however, the roots were dilacerated.  Therefore, a stryker surgical drill was used to section tooth and then a forceps was used to remove the mesial and distal portions of tooth. A curette was used to debride the socket. The 15 blade was then used to make a stab incision just distal and lateral to the area of #32 and a hemostat was used to explore this area.  Suture was placed at the extraction site 3-0 chromic was used.  Once again, copious irrigation was used throughout.    Finally, a 1/4 inch penrose drain was placed into the submasseteric, submandibular, and pterygomandibular spaces.  The drains were secured to skin with 2-0 Silk suture.  The mouth was irrigated and suction, and the throat pack was removed.  All counts were correct.  A Mepilex dressing was placed over the drains.  ANESTHESIA:   general  EBL:  Total I/O In: 1000 [I.V.:1000] Out: -   DRAINS: none   LOCAL MEDICATIONS USED:  0.5% MARCAINE with 1:200,000 epinephrine - 6 mL, and 2.0% LIDOCAINE with 1:100,000 epinephrine - 10 mL   SPECIMEN:  No Specimen  DISPOSITION OF SPECIMEN:  N/A  COUNTS:  YES  DICTATION: .Note written in EPIC  PLAN OF CARE: Admit to inpatient   PATIENT DISPOSITION:  PACU - hemodynamically stable.   Delay start of  Pharmacological VTE agent (>24hrs) due to surgical blood loss or risk of bleeding:  not applicable

## 2012-09-27 NOTE — Interval H&P Note (Signed)
History and Physical Interval Note:  09/27/2012 2:31 PM  [name]  has presented today for surgery, with the diagnosis of FACIAL ABSCESS   The various methods of treatment have been discussed with the patient and family. After consideration of risks, benefits and other options for treatment, the patient has consented to  Procedure(s) (LRB) with comments: EXTRACTION MOLARS (Right) - INTRAORAL, EXTRAORAL,,  I AND D SURGICAL REMOVAL # 30  as a surgical intervention .  The patient's history has been reviewed, patient examined, no change in status, stable for surgery.  I have reviewed the patient's chart and labs.  Questions were answered to the patient's satisfaction.     [place],Veena Sturgess L

## 2012-09-27 NOTE — Progress Notes (Signed)
[facility] PROGRESS NOTE  Glenda Cardenas FAO:[id number] DOB: 1971/03/30 DOA: 09/25/2012 PCP: Lolita Patella, MD  Brief narrative 42 year old female with history of HTN, hypothyroidism following RAI, admitted on 1/22 with one week of worsening pain and swelling in the right lower jaw. Seen by dental surgeon OP and root canal it meant done but symptoms worsened despite that. In the ED, CT showed right-sided perimandibular abscess with cellulitis possibly from periapical abscess associated with tooth #30.  Assessment/Plan:  Carious tooth #30 (root canal treated) with periapical abscess and right submasseteric, submandibular, and pterygomandibular space infection. Dr. Mauri Reading surgeons input appreciated. Plans for OR for extraoral and intraoral I&D and removal of #30 on 1/24. Currently n.p.o. per surgery. Pain adequately controlled. Continue IV Unasyn.  Hypoglycemia Not a known diabetic. Currently n.p.o. Treat with IV dextrose and monitor CBGs closely-no further episodes.  Hypokalemia Repleted. Follow BMP  Anemia ? Dilutional. Follow CBC in a.m.  HTN Controlled. Continue BB.  Hypothyroid, S/P RAI Continue Synthroid.  Code Status: Full Family Communication: Discussed with spouse at bedside. Disposition Plan: Home when medically stable.   Consultants:  Dr. Peggyann Shoals surgeon  Procedures:  None  Antibiotics:  IV Unasyn 1/22 >   HPI/Subjective: Right lower jaw pain-2/10 after pain medications. Unable to open mouth secondary to pain and swelling-unchanged. Able to swallow pills and liquids.  Objective: Filed Vitals:   09/26/12 1445 09/26/12 2201 09/27/12 0609 09/27/12 1243  BP:  115/64 109/56 142/64  Pulse:  72 61 67  Temp:  100 F (37.8 C) 98.9 F (37.2 C) 99.6 F (37.6 C)  TempSrc:  Oral Oral Oral  Resp:  16 18 18   Height: 5\' 4"  (1.626 m)     Weight: 66.633 kg (146 lb 14.4 oz)     SpO2:  97% 99% 100%    Intake/Output  Summary (Last 24 hours) at 09/27/12 1439 Last data filed at 09/27/12 0558  Gross per 24 hour  Intake   2025 ml  Output      1 ml  Net   2024 ml   Filed Weights   09/26/12 1445  Weight: 66.633 kg (146 lb 14.4 oz)    Exam:   General exam: Pleasant and comfortable.  Respiratory system: Clear to auscultation. No increased work of breathing.  Cardiovascular system: First and second heart sounds heard, regular rate and rhythm. No JVD, murmurs or pedal edema.  Gastrointestinal system: Abdomen is nondistended, soft and nontender. Normal bowel sounds heard.  Central nervous system: Alert and oriented. No focal deficits.  HEENT, neck: Right mid mandibular swelling-hard, smooth surface, mildly tender and warm , nonpulsatile and no fluctuation. Measures approximately 3-4 cm diameter. Unable to open mouth beyond approximately 2 cm. No change in exam since 1/23  Data Reviewed: Basic Metabolic Panel:  Lab 09/27/12 6962 09/26/12 0630 09/25/12 1752  NA 139 138 138  K 3.6 3.5 3.4*  CL 103 102 98  CO2 27 25 28   GLUCOSE 113* 87 99  BUN 3* 7 7  CREATININE 0.54 0.63 0.65  CALCIUM 8.1* 8.3* 10.2  MG -- -- --  PHOS -- -- --   Liver Function Tests:  Lab 09/25/12 1752  AST 16  ALT 10  ALKPHOS 97  BILITOT 0.5  PROT 7.8  ALBUMIN 4.3   No results found for this basename: LIPASE:5,AMYLASE:5 in the last 168 hours No results found for this basename: AMMONIA:5 in the last 168 hours CBC:  Lab 09/27/12 0625 09/26/12 0630 09/25/12 1752  WBC 8.3  9.1 11.0*  NEUTROABS -- -- 7.8*  HGB 10.5* 11.7* 13.2  HCT 31.9* 34.7* 38.5  MCV 89.1 88.7 87.5  PLT 259 286 336   Cardiac Enzymes: No results found for this basename: CKTOTAL:5,CKMB:5,CKMBINDEX:5,TROPONINI:5 in the last 168 hours BNP (last 3 results) No results found for this basename: PROBNP:3 in the last 8760 hours CBG:  Lab 09/27/12 1221 09/27/12 0822 09/27/12 0406 09/26/12 2348 09/26/12 2001  GLUCAP 99 103* 96 101* 73    Recent  Results (from the past 240 hour(s))  CULTURE, BLOOD (ROUTINE X 2)     Status: Normal (Preliminary result)   Collection Time   09/25/12  8:32 PM      Component Value Range Status Comment   Specimen Description BLOOD HAND RIGHT   Final    Special Requests BOTTLES DRAWN AEROBIC ONLY   Final    Culture  Setup Time 09/26/2012 02:04   Final    Culture     Final    Value:        BLOOD CULTURE RECEIVED NO GROWTH TO DATE CULTURE WILL BE HELD FOR 5 DAYS BEFORE ISSUING A FINAL NEGATIVE REPORT   Report Status PENDING   Incomplete   CULTURE, BLOOD (ROUTINE X 2)     Status: Normal (Preliminary result)   Collection Time   09/25/12  8:42 PM      Component Value Range Status Comment   Specimen Description BLOOD HAND LEFT   Final    Special Requests BOTTLES DRAWN AEROBIC ONLY   Final    Culture  Setup Time 09/26/2012 02:05   Final    Culture     Final    Value:        BLOOD CULTURE RECEIVED NO GROWTH TO DATE CULTURE WILL BE HELD FOR 5 DAYS BEFORE ISSUING A FINAL NEGATIVE REPORT   Report Status PENDING   Incomplete   SURGICAL PCR SCREEN     Status: Normal   Collection Time   09/27/12  4:00 AM      Component Value Range Status Comment   MRSA, PCR NEGATIVE  NEGATIVE Final    Staphylococcus aureus NEGATIVE  NEGATIVE Final      Studies: Dg Orthopantogram  09/25/2012  *RADIOLOGY REPORT*  Clinical Data: Right lower dental abscess.  ORTHOPANTOGRAM/PANORAMIC  Comparison: CT of the neck on 09/25/2012  Findings: The right lower first molar has a root canal.  There is lucency below the tooth crown that is concerning for infection or abscess.  There is also periapical lucency around this tooth which was seen on the recent CT examination. The patient has additional dental hardware.  The third molars are present.  IMPRESSION: There is periapical lucency around the right lower first molar. This also lucency underneath the crown.  Findings are concerning for infection.   Original Report Authenticated By: Richarda Overlie,  M.D.    Ct Soft Tissue Neck W Contrast  09/25/2012   *RADIOLOGY REPORT*  Clinical Data: Right facial swelling and difficulty swallowing. Root canal last Tuesday.  CT NECK WITH CONTRAST  Technique:  Multidetector CT imaging of the neck was performed with intravenous contrast.  Contrast: 75mL OMNIPAQUE IOHEXOL 300 MG/ML  SOLN  Comparison: None.  Findings: The patient has a perimandibular abscess medial and lateral to the posterior right mandibular body extending toward the angle of the mandible.  This is adjacent to the roots of tooth number three.  There is a small defect in the cortex of the mandible medially at  the anterior root of that tooth.  The patient has impacted bilateral mandibular and maxillary third molars.  There is soft tissue swelling of the overlying masseter muscle with soft tissue stranding in the cheek consistent with cellulitis. Reactive submandibular adenopathy on the right.  Right submandibular gland is normal.  Right parotid gland is normal.  The visualized portions of paranasal sinuses and mastoid air cells are normal.  The visualized portion of the brain is normal.  Orbits are normal.  No prevertebral soft tissue swelling.  Epiglottis is normal.  No enlargement of the adenoids.  Lingual tonsils are normal.  IMPRESSION: Right perimandibular abscess, probably originating from the periapical abscesses at the roots of tooth #3. The abscess measures approximately 2 x 3 cm.  Adjacent cellulitis, myositis and reactive adenopathy.   Original Report Authenticated By: Francene Boyers, M.D.     Scheduled Meds:    . ampicillin-sulbactam (UNASYN) IV  3 g Intravenous Q6H  . levothyroxine  150 mcg Oral QAC breakfast  . nebivolol  5 mg Oral Daily   Continuous Infusions:    . dextrose 5 % and 0.9 % NaCl with KCl 20 mEq/L 75 mL/hr at 09/27/12 0758    Principal Problem:  *Periapical abscess Active Problems:  Hypothyroidism  HTN (hypertension)  Anemia  Hypokalemia   Hypoglycemia    Time spent: 35 minutes    [facility]  [facility] Pager [phone]. If 8PM-8AM, please contact night-coverage at [email or web address], password [facility] 09/27/2012, 2:39 PM  LOS: 2 days

## 2012-09-27 NOTE — Anesthesia Postprocedure Evaluation (Signed)
  Anesthesia Post-op Note  Patient: Glenda Cardenas  Procedure(s) Performed: Procedure(s) (LRB) with comments: EXTRACTION MOLARS (Right) - INTRAORAL, EXTRAORAL,,  IRRIGATION AND DEBRIDEMENT SURGICAL REMOVAL # 30   Patient Location: PACU  Anesthesia Type:General  Level of Consciousness: awake and alert   Airway and Oxygen Therapy: Patient Spontanous Breathing and Patient connected to nasal cannula oxygen  Post-op Pain: none  Post-op Assessment: Post-op Vital signs reviewed, Patient's Cardiovascular Status Stable, Respiratory Function Stable, Patent Airway and No signs of Nausea or vomiting  Post-op Vital Signs: Reviewed and stable  Complications: No apparent anesthesia complications

## 2012-09-27 NOTE — Brief Op Note (Signed)
09/25/2012 - 09/27/2012  4:30 PM  PATIENT:  Glenda Cardenas  42 y.o. female  PRE-OPERATIVE DIAGNOSIS:  FACIAL ABSCESS, CARIOUS #30  POST-OPERATIVE DIAGNOSIS:  SUB MASSETERIC, SUBMANDIBULAR, PTERYGOMANDIBULAR SPACE ABSCESS, CARIOUS #30 WITH FAILED ROOT CANAL   PROCEDURE:  Procedure(s) (LRB) with comments: EXTRACTION MOLARS (Right) - INTRAORAL, EXTRAORAL,,  IRRIGATION AND DEBRIDEMENT SURGICAL REMOVAL # 30   SURGEON:  Surgeon(s) and Role:    * Francene Finders, DDS - Primary  PHYSICIAN ASSISTANT: None  ASSISTANTS: none   ANESTHESIA:   general  EBL:  Total I/O In: 1000 [I.V.:1000] Out: -   BLOOD ADMINISTERED:none  DRAINS: none   LOCAL MEDICATIONS USED:  MARCAINE    and LIDOCAINE   SPECIMEN:  No Specimen  DISPOSITION OF SPECIMEN:  N/A  COUNTS:  YES  TOURNIQUET:  * No tourniquets in log *  DICTATION: .Note written in EPIC  PLAN OF CARE: Admit to inpatient   PATIENT DISPOSITION:  PACU - hemodynamically stable.   Delay start of Pharmacological VTE agent (>24hrs) due to surgical blood loss or risk of bleeding: not applicable

## 2012-09-27 NOTE — Anesthesia Procedure Notes (Signed)
Procedure Name: Intubation Date/Time: 09/27/2012 3:13 PM Performed by: Darcey Nora B Pre-anesthesia Checklist: Patient identified, Emergency Drugs available, Suction available and Patient being monitored Patient Re-evaluated:Patient Re-evaluated prior to inductionOxygen Delivery Method: Circle system utilized Preoxygenation: Pre-oxygenation with 100% oxygen Intubation Type: IV induction Ventilation: Mask ventilation without difficulty and Nasal airway inserted- appropriate to patient size Laryngoscope Size: Mac and 3 Grade View: Grade II Tube type: Oral Nasal Tubes: Left, Nasal Rae and Magill forceps- large, utilized Tube size: 7.0 mm Number of attempts: 1 (92fr rubber cath via left nare to intro Rae tube) Placement Confirmation: ETT inserted through vocal cords under direct vision,  breath sounds checked- equal and bilateral and positive ETCO2 Tube secured with: Tape (taped to gauze pads on forhead) Dental Injury: Teeth and Oropharynx as per pre-operative assessment

## 2012-09-27 NOTE — Progress Notes (Signed)
ANTIBIOTIC CONSULT NOTE - FOLLOW UP  Pharmacy Consult for Unasyn Indication: Perimandibular abscess with cellulitis/periapical abscess  No Known Allergies  Patient Measurements: Height: 5\' 4"  (162.6 cm) Weight: 146 lb 14.4 oz (66.633 kg) IBW/kg (Calculated) : 54.7  Adjusted Body Weight:   Vital Signs: Temp: 98.9 F (37.2 C) (01/24 0609) Temp src: Oral (01/24 0609) BP: 109/56 mmHg (01/24 0609) Pulse Rate: 61  (01/24 0609) Intake/Output from previous day: 01/23 0701 - 01/24 0700 In: 2745 [P.O.:720; I.V.:2025] Out: 2 [Urine:2] Intake/Output from this shift:    Labs:  Basename 09/27/12 0625 09/26/12 0630 09/25/12 1752  WBC 8.3 9.1 11.0*  HGB 10.5* 11.7* 13.2  PLT 259 286 336  LABCREA -- -- --  CREATININE 0.54 0.63 0.65   Estimated Creatinine Clearance: 86 ml/min (by C-G formula based on Cr of 0.54). No results found for this basename: VANCOTROUGH:2,VANCOPEAK:2,VANCORANDOM:2,GENTTROUGH:2,GENTPEAK:2,GENTRANDOM:2,TOBRATROUGH:2,TOBRAPEAK:2,TOBRARND:2,AMIKACINPEAK:2,AMIKACINTROU:2,AMIKACIN:2, in the last 72 hours   Microbiology: Recent Results (from the past 720 hour(s))  CULTURE, BLOOD (ROUTINE X 2)     Status: Normal (Preliminary result)   Collection Time   09/25/12  8:32 PM      Component Value Range Status Comment   Specimen Description BLOOD HAND RIGHT   Final    Special Requests BOTTLES DRAWN AEROBIC ONLY   Final    Culture  Setup Time 09/26/2012 02:04   Final    Culture     Final    Value:        BLOOD CULTURE RECEIVED NO GROWTH TO DATE CULTURE WILL BE HELD FOR 5 DAYS BEFORE ISSUING A FINAL NEGATIVE REPORT   Report Status PENDING   Incomplete   CULTURE, BLOOD (ROUTINE X 2)     Status: Normal (Preliminary result)   Collection Time   09/25/12  8:42 PM      Component Value Range Status Comment   Specimen Description BLOOD HAND LEFT   Final    Special Requests BOTTLES DRAWN AEROBIC ONLY   Final    Culture  Setup Time 09/26/2012 02:05   Final    Culture      Final    Value:        BLOOD CULTURE RECEIVED NO GROWTH TO DATE CULTURE WILL BE HELD FOR 5 DAYS BEFORE ISSUING A FINAL NEGATIVE REPORT   Report Status PENDING   Incomplete   SURGICAL PCR SCREEN     Status: Normal   Collection Time   09/27/12  4:00 AM      Component Value Range Status Comment   MRSA, PCR NEGATIVE  NEGATIVE Final    Staphylococcus aureus NEGATIVE  NEGATIVE Final     Anti-infectives     Start     Dose/Rate Route Frequency Ordered Stop   09/26/12 0200   Ampicillin-Sulbactam (UNASYN) 3 g in sodium chloride 0.9 % 100 mL IVPB        3 g 100 mL/hr over 60 Minutes Intravenous Every 6 hours 09/25/12 2248     09/25/12 2300   Ampicillin-Sulbactam (UNASYN) 3 g in sodium chloride 0.9 % 100 mL IVPB  Status:  Discontinued        3 g 100 mL/hr over 60 Minutes Intravenous Every 6 hours 09/25/12 2238 09/25/12 2248   09/25/12 2045   vancomycin (VANCOCIN) IVPB 1000 mg/200 mL premix  Status:  Discontinued        1,000 mg 200 mL/hr over 60 Minutes Intravenous  Once 09/25/12 2035 09/25/12 2036   09/25/12 2015   Ampicillin-Sulbactam (UNASYN) 3 g  in sodium chloride 0.9 % 100 mL IVPB        3 g 100 mL/hr over 60 Minutes Intravenous  Once 09/25/12 2010 09/25/12 2159          Assessment: 42yo female on Unasyn 3gm IV q6 for dental abscesses, plan for tooth extraction today.  Renal function is stable.  Blood cultures are NTD.  Goal of Therapy:  Resolution of infection  Plan:  1.  Continue current dosing 2.  Pharmacy will sign-off as renal function is stable and dosage adjustments are necessary.  Please reconsult as needed.  Marisue Humble, PharmD Clinical Pharmacist [facility]

## 2012-09-27 NOTE — Anesthesia Preprocedure Evaluation (Signed)
Anesthesia Evaluation  Patient identified by MRN, date of birth, ID band Patient awake    Reviewed: Allergy & Precautions, H&P , NPO status , Patient's Chart, lab work & pertinent test results  Airway Mallampati: I TM Distance: >3 FB Neck ROM: full    Dental   Pulmonary          Cardiovascular hypertension, Rhythm:regular Rate:Normal     Neuro/Psych    GI/Hepatic   Endo/Other  Hypothyroidism   Renal/GU      Musculoskeletal   Abdominal   Peds  Hematology   Anesthesia Other Findings   Reproductive/Obstetrics                           Anesthesia Physical Anesthesia Plan  ASA: II  Anesthesia Plan: General   Post-op Pain Management:    Induction: Intravenous  Airway Management Planned: Nasal ETT and Oral ETT  Additional Equipment:   Intra-op Plan:   Post-operative Plan:   Informed Consent: I have reviewed the patients History and Physical, chart, labs and discussed the procedure including the risks, benefits and alternatives for the proposed anesthesia with the patient or authorized representative who has indicated his/her understanding and acceptance.     Plan Discussed with: CRNA, Anesthesiologist and Surgeon  Anesthesia Plan Comments:         Anesthesia Quick Evaluation

## 2012-09-27 NOTE — Transfer of Care (Signed)
Immediate Anesthesia Transfer of Care Note  Patient: [name]  Procedure(s) Performed: Procedure(s) (LRB) with comments: EXTRACTION MOLARS (Right) - INTRAORAL, EXTRAORAL,,  IRRIGATION AND DEBRIDEMENT SURGICAL REMOVAL # 30   Patient Location: PACU  Anesthesia Type:General  Level of Consciousness: awake, alert , oriented and patient cooperative  Airway & Oxygen Therapy: Patient Spontanous Breathing and Patient connected to nasal cannula oxygen  Post-op Assessment: Report given to PACU RN, Post -op Vital signs reviewed and stable and Patient moving all extremities  Post vital signs: Reviewed and stable  Complications: No apparent anesthesia complications

## 2012-09-28 LAB — CBC
HCT: 32.9 % — ABNORMAL LOW (ref 36.0–46.0)
MCH: 29.6 pg (ref 26.0–34.0)
MCV: 89.4 fL (ref 78.0–100.0)
RBC: 3.68 MIL/uL — ABNORMAL LOW (ref 3.87–5.11)
RDW: 12.5 % (ref 11.5–15.5)
WBC: 8.5 10*3/uL (ref 4.0–10.5)

## 2012-09-28 LAB — GLUCOSE, CAPILLARY
Glucose-Capillary: 104 mg/dL — ABNORMAL HIGH (ref 70–99)
Glucose-Capillary: 113 mg/dL — ABNORMAL HIGH (ref 70–99)

## 2012-09-28 NOTE — Progress Notes (Signed)
Oral & Maxillofacial Surgery - Progress Note  Glenda Cardenas is a 42 y.o. female patient now POD 1 s/p I&D of Right Submasseteric, Submandibular, and Pterygomandibular spaces and Surgical Extraction of #30.  She is feeling better, but has had a low grade fever which has responded to Tylenol.    Diagnosis:  1. Facial abscess   2. HTN (hypertension)   3. Hypothyroidism   4. Periapical abscess   5. Hypoglycemia   6. Hypokalemia   7. Anemia     PMHx:  Past Medical History  Diagnosis Date  . Hypertension   . Thyroid disease     Meds: Reviewed  Allergies: No Known Allergies  Problems: Principal Problem:  *Periapical abscess Active Problems:  Hypothyroidism  HTN (hypertension)  Anemia  Hypokalemia  Hypoglycemia   Vitals: BP 109/59  Pulse 55  Temp 98.4 F (36.9 C) (Axillary)  Resp 16  Ht 5\' 4"  (1.626 m)  Wt 66.633 kg (146 lb 14.4 oz)  BMI 25.22 kg/m2  SpO2 93%  LMP 09/25/2012    Labs:    Lab 09/28/12 0550 09/27/12 0625 09/26/12 0630  WBC 8.5 8.3 9.1  HGB 10.9* 10.5* 11.7*  HCT 32.9* 31.9* 34.7*  PLT 287 259 286    Lab 09/27/12 0625 09/26/12 0630 09/25/12 1752  NA 139 138 138  K 3.6 3.5 3.4*  CL 103 102 98  CO2 27 25 28   BUN 3* 7 7  CREATININE 0.54 0.63 0.65  CALCIUM 8.1* 8.3* 10.2  PROT -- -- 7.8  BILITOT -- -- 0.5  ALKPHOS -- -- 97  ALT -- -- 10  AST -- -- 16  GLUCOSE 113* 87 99       Radiology: No results found.  Exam: General appearance: alert and cooperative Head: Normocephalic, without obvious abnormality, atraumatic Neck: Mepilex dressing is in place with Medpore tape, Minimal serosanguinous drainage on dressing.  The drains x 3 are in place and remained secured with the silk suture.  There is mild/moderate erythema which I have traced out around the neck with a skin marker.    Intraoral Exam:  The trismus remains, the patient also has some hoarseness now - likely from intubation, she is able to tolerate eating and is able to handle  her secretions.   Impression: Glenda Cardenas is healing well s/p intraoral and extraoral I&D of right fascial spaces and extraction of #30 1. Will d/c drain tomorrow prior to discharge assuming that she continues to do well 2. The patient will follow up in my office on Monday afternoon, and is to call for an appointment [phone]) 3. She will need to go home on PO antibiotics (Augmentin 875 bid and Flagyl 500 qid) for 7 days 4. I would recommend she take an additional week away from work.  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  She will also require narcotic pain medication for several more days.   5. Her Calcium and Hemoglobin/Hematocrit are decreased - I will defer management to our excellent Internal Medicine Service.   6. The WBC is within normal limits, which is a positive indicator that she is doing well.   [place],Adreona Brand L 09/28/2012, 5:54 PM

## 2012-09-28 NOTE — Progress Notes (Signed)
[facility] PROGRESS NOTE  Glenda Cardenas AVW:[id number] DOB: [birth year] DOA: 09/25/2012 PCP: Lolita Patella, MD  Brief narrative 42 year old female with history of HTN, hypothyroidism following RAI, admitted on 1/22 with one week of worsening pain and swelling in the right lower jaw. Seen by dental surgeon OP and root canal it meant done but symptoms worsened despite that. In the ED, CT showed right-sided perimandibular abscess with cellulitis possibly from periapical abscess associated with tooth #30.  Assessment/Plan:  Carious tooth #30 (root canal treated) with periapical abscess and right submasseteric, submandibular, and pterygomandibular space infection.  Dr. Mauri Reading surgeons following. Continue IV Unasyn. S/P carious # 30 extraction, I&D on 1/24.  Moderate pain. Mx per Dr. Jeanice Lim  Hypoglycemia Not a known diabetic. Treat with IV dextrose and monitor CBGs closely-no further episodes. Tolerating oral intake. D/C IVF and monitor.  Hypokalemia Repleted.   Anemia Stable   HTN Controlled. Continue BB. Occasional elevated BP, likely secondary to pain. Monitor.  Hypothyroid, S/P RAI Continue Synthroid.  Code Status: Full Family Communication: Discussed with spouse at bedside. Disposition Plan: Home when medically stable and cleared by surgeon. ? 1/26.   Consultants:  Dr. Peggyann Shoals surgeon  Procedures:  None  Antibiotics:  IV Unasyn 1/22 >   HPI/Subjective: Right lower jaw pain.  Objective: Filed Vitals:   09/27/12 2246 09/27/12 2314 09/28/12 0148 09/28/12 0615  BP: 151/116 170/90 112/52 106/67  Pulse: 78  82 67  Temp: 99.5 F (37.5 C)  98.8 F (37.1 C) 97.5 F (36.4 C)  TempSrc: Axillary  Axillary Axillary  Resp: 20     Height:      Weight:      SpO2: 96%  92% 93%    Intake/Output Summary (Last 24 hours) at 09/28/12 1140 Last data filed at 09/28/12 0552  Gross per 24 hour  Intake   1880 ml  Output    562 ml    Net   1318 ml   Filed Weights   09/26/12 1445  Weight: 66.633 kg (146 lb 14.4 oz)    Exam:   General exam: Pleasant and comfortable.  Respiratory system: Clear to auscultation. No increased work of breathing.  Cardiovascular system: First and second heart sounds heard, regular rate and rhythm. No JVD, murmurs or pedal edema.  Gastrointestinal system: Abdomen is nondistended, soft and nontender. Normal bowel sounds heard.  Central nervous system: Alert and oriented. No focal deficits.  HEENT, neck: dressing over right lower jaw recently changed- clean, dry and intact.   Data Reviewed: Basic Metabolic Panel:  Lab 09/27/12 8295 09/26/12 0630 09/25/12 1752  NA 139 138 138  K 3.6 3.5 3.4*  CL 103 102 98  CO2 27 25 28   GLUCOSE 113* 87 99  BUN 3* 7 7  CREATININE 0.54 0.63 0.65  CALCIUM 8.1* 8.3* 10.2  MG -- -- --  PHOS -- -- --   Liver Function Tests:  Lab 09/25/12 1752  AST 16  ALT 10  ALKPHOS 97  BILITOT 0.5  PROT 7.8  ALBUMIN 4.3   No results found for this basename: LIPASE:5,AMYLASE:5 in the last 168 hours No results found for this basename: AMMONIA:5 in the last 168 hours CBC:  Lab 09/28/12 0550 09/27/12 0625 09/26/12 0630 09/25/12 1752  WBC 8.5 8.3 9.1 11.0*  NEUTROABS -- -- -- 7.8*  HGB 10.9* 10.5* 11.7* 13.2  HCT 32.9* 31.9* 34.7* 38.5  MCV 89.4 89.1 88.7 87.5  PLT 287 259 286 336   Cardiac Enzymes: No results  found for this basename: CKTOTAL:5,CKMB:5,CKMBINDEX:5,TROPONINI:5 in the last 168 hours BNP (last 3 results) No results found for this basename: PROBNP:3 in the last 8760 hours CBG:  Lab 09/28/12 0755 09/28/12 0425 09/27/12 2335 09/27/12 2009 09/27/12 1221  GLUCAP 122* 104* 123* 112* 99    Recent Results (from the past 240 hour(s))  CULTURE, BLOOD (ROUTINE X 2)     Status: Normal (Preliminary result)   Collection Time   09/25/12  8:32 PM      Component Value Range Status Comment   Specimen Description BLOOD HAND RIGHT   Final     Special Requests BOTTLES DRAWN AEROBIC ONLY   Final    Culture  Setup Time 09/26/2012 02:04   Final    Culture     Final    Value:        BLOOD CULTURE RECEIVED NO GROWTH TO DATE CULTURE WILL BE HELD FOR 5 DAYS BEFORE ISSUING A FINAL NEGATIVE REPORT   Report Status PENDING   Incomplete   CULTURE, BLOOD (ROUTINE X 2)     Status: Normal (Preliminary result)   Collection Time   09/25/12  8:42 PM      Component Value Range Status Comment   Specimen Description BLOOD HAND LEFT   Final    Special Requests BOTTLES DRAWN AEROBIC ONLY   Final    Culture  Setup Time 09/26/2012 02:05   Final    Culture     Final    Value:        BLOOD CULTURE RECEIVED NO GROWTH TO DATE CULTURE WILL BE HELD FOR 5 DAYS BEFORE ISSUING A FINAL NEGATIVE REPORT   Report Status PENDING   Incomplete   SURGICAL PCR SCREEN     Status: Normal   Collection Time   09/27/12  4:00 AM      Component Value Range Status Comment   MRSA, PCR NEGATIVE  NEGATIVE Final    Staphylococcus aureus NEGATIVE  NEGATIVE Final      Studies: No results found.  Scheduled Meds:    . ampicillin-sulbactam (UNASYN) IV  3 g Intravenous Q6H  . levothyroxine  150 mcg Oral QAC breakfast  . nebivolol  5 mg Oral Daily   Continuous Infusions:    . dextrose 5 % and 0.9 % NaCl with KCl 20 mEq/L 75 mL/hr at 09/28/12 1191    Principal Problem:  *Periapical abscess Active Problems:  Hypothyroidism  HTN (hypertension)  Anemia  Hypokalemia  Hypoglycemia    Time spent: 35 minutes    [facility]  [facility] Pager [phone]. If 8PM-8AM, please contact night-coverage at [email or web address], password [facility] 09/28/2012, 11:40 AM  LOS: 3 days

## 2012-09-29 LAB — GLUCOSE, CAPILLARY
Glucose-Capillary: 118 mg/dL — ABNORMAL HIGH (ref 70–99)
Glucose-Capillary: 102 mg/dL — ABNORMAL HIGH (ref 70–99)

## 2012-09-29 MED ORDER — AMOXICILLIN-POT CLAVULANATE 875-125 MG PO TABS: 1.0000 | ORAL_TABLET | Freq: Two times a day (BID) | ORAL | Status: DC

## 2012-09-29 MED ORDER — HYDROCODONE-ACETAMINOPHEN 5-325 MG PO TABS: 1.0000 | ORAL_TABLET | ORAL | Status: DC | PRN

## 2012-09-29 MED ORDER — METRONIDAZOLE 500 MG PO TABS: 500.0000 mg | ORAL_TABLET | Freq: Four times a day (QID) | ORAL | Status: DC

## 2012-09-29 NOTE — ED Provider Notes (Signed)
Medical screening examination/treatment/procedure(s) were performed by non-physician practitioner and as supervising physician I was immediately available for consultation/collaboration.  Marwan T Powers, MD 09/29/12 0714 

## 2012-09-29 NOTE — Progress Notes (Signed)
Oral & Maxillofacial Surgery - Progress Note   Glenda Cardenas is a 42 y.o. female patient now POD 2 s/p I&D of Right Submasseteric, Submandibular, and Pterygomandibular spaces and Surgical Extraction of #30. She is doing well and is requesting to go home.  Diagnosis:  1.  Facial abscess   2.  HTN (hypertension)   3.  Hypothyroidism   4.  Periapical abscess   5.  Hypoglycemia   6.  Hypokalemia   7.  Anemia    PMHx:  Past Medical History   Diagnosis  Date   .  Hypertension    .  Thyroid disease     Meds: Reviewed  Allergies: No Known Allergies  Problems: Principal Problem:  *Periapical abscess  Active Problems:  Hypothyroidism  HTN (hypertension)  Anemia  Hypokalemia  Hypoglycemia    Vitals: BP 155/80  Pulse 64  Temp 98.5 F (36.9 C) (Oral)  Resp 20  Ht 5\' 4"  (1.626 m)  Wt 66.633 kg (146 lb 14.4 oz)  BMI 25.22 kg/m2  SpO2 99%  LMP 09/25/2012   Lab 09/28/12 0550 09/27/12 0625 09/26/12 0630  WBC 8.5 8.3 9.1  HGB 10.9* 10.5* 11.7*  HCT 32.9* 31.9* 34.7*  PLT 287 259 286      Lab 09/27/12 0625 09/26/12 0630 09/25/12 1752  NA 139 138 138  K 3.6 3.5 3.4*  CL 103 102 98  CO2 27 25 28   BUN 3* 7 7  CREATININE 0.54 0.63 0.65  CALCIUM 8.1* 8.3* 10.2  PROT -- -- 7.8  BILITOT -- -- 0.5  ALKPHOS -- -- 97  ALT -- -- 10  AST -- -- 16  GLUCOSE 113* 87 99    Exam: General appearance: alert and cooperative  Head: Normocephalic, without obvious abnormality, atraumatic  Neck: Mepilex dressing is in place with Medpore tape, Minimal serosanguinous drainage on dressing. The drains x 3 are in place and remained secured with the silk suture. There is mild erythema which has improved since yesterday.   Intraoral Exam: The trismus remains; however, her hoarseness has improved, she is able to tolerate eating and is able to handle her secretions.   Impression: Merrisa is healing well s/p intraoral and extraoral I&D of right fascial spaces and extraction of #30  1.  Removed drain today 2. The patient will follow up in my office on Monday or Tuesday afternoon, and is to call for an appointment [phone])  3. She is stable for discharge from my standpoint 4. She will need to go home on PO antibiotics (Augmentin 875 bid and Flagyl 500 qid) for 7 days and appropriate narcotic medication for pain. 5. I would recommend she take an additional week away from work.   [place],Verbena Boeding L 09/29/2012, 1:43 PM

## 2012-09-29 NOTE — Progress Notes (Addendum)
Discharge instructions gone over with patient. Home medications gone over. Prescription given. Other medicine faxed to patients' pharmacy.  Follow up appointments to be made.  Signs and symptoms of infection and or worsening conditions gone over. Work not given. Diet, activity and incisional care gone over. Patient verbalized understanding of all instructions. My chart instructions gone over.

## 2012-09-29 NOTE — Discharge Summary (Signed)
Physician Discharge Summary  Glenda Cardenas ZOX:[id number] DOB: June 22, 1971 DOA: 09/25/2012  PCP: Lolita Patella, MD  Admit date: 09/25/2012 Discharge date: 09/29/2012  Time spent: Greater than 30 minutes  Recommendations for Outpatient Follow-up:  1. With Dr. Lincoln Brigham, dental surgeon. Patient is to call on 1/27 for an appointment to be seen on 1/27 on 1/28. 2. With Dr. Elias Else, PCP  Discharge Diagnoses:  Principal Problem:  *Periapical abscess Active Problems:  Hypothyroidism  HTN (hypertension)  Anemia  Hypokalemia  Hypoglycemia   Discharge Condition: Improved and stable.  Diet recommendation: Bland diet.  Filed Weights   09/26/12 1445  Weight: 66.633 kg (146 lb 14.4 oz)    History of present illness:  42 year old female with history of HTN, hypothyroidism following RAI, admitted on 1/22 with one week of worsening pain and swelling in the right lower jaw. Seen by dental surgeon OP and root canal it meant done but symptoms worsened despite that. In the ED, CT showed right-sided perimandibular abscess with cellulitis possibly from periapical abscess associated with tooth #30.  Hospital Course:   Carious tooth #30 (root canal treated) with periapical abscess and right submasseteric, submandibular, and pterygomandibular space infection.  Admitted to the hospital. Dental surgery was consulted. Patient was started empirically on IV Unasyn  And pain medications.Dr. Jeanice Lim performed carious # 30 extraction, I&D on 1/24. He has seen her today, removed drain and cleared her for discharge home on additional 7 days of antibiotics (Augmentin 875 twice a day and Flagyl 500 mg 4 times a day). Patient is to followup with him in his office tomorrow or the day after. Pain is improving. Able to open her mouth wider.  Hypoglycemia  Not a known diabetic. ? Secondary to n.p.o. status. Treated with IV dextrose-containing fluids. Resolved.  Hypokalemia  Repleted.    Anemia  Stable. Consider outpatient workup as deemed necessary   HTN  Controlled. Continue BB.   Hypothyroid, S/P RAI  Continue Synthroid.   Consultants:  Dr. Peggyann Shoals surgeon  Procedures:   S/P carious # 30 extraction, I&D on 1/24.   Discharge Exam:  Complaints Decreasing pain right lower jaw. Able to open mouth wider. Tolerating oral intake. Eager to go home.  Filed Vitals:   09/28/12 0615 09/28/12 1408 09/28/12 2220 09/29/12 0500  BP: 106/67 109/59 145/67 155/80  Pulse: 67 55 57 64  Temp: 97.5 F (36.4 C) 98.4 F (36.9 C) 98.1 F (36.7 C) 98.5 F (36.9 C)  TempSrc: Axillary Axillary Axillary Oral  Resp:  16 16 20   Height:      Weight:      SpO2: 93% 93% 99% 99%    General exam: Pleasant and comfortable.  Respiratory system: Clear to auscultation. No increased work of breathing.  Cardiovascular system: First and second heart sounds heard, regular rate and rhythm. No JVD, murmurs or pedal edema.  Gastrointestinal system: Abdomen is nondistended, soft and nontender. Normal bowel sounds heard.  Central nervous system: Alert and oriented. No focal deficits.  HEENT, neck: dressing over right lower jaw recently changed- clean, dry and intact.   Discharge Instructions      Discharge Orders    Future Appointments: Provider: Department: Dept Phone: Center:   10/14/2012 4:20 PM [facility]  IMAGING [phone] [facility]     Future Orders Please Complete By Expires   Increase activity slowly      Call MD for:  temperature >100.4      Call MD for:  severe uncontrolled pain      Call MD for:  redness, tenderness, or signs of infection (pain, swelling, redness, odor or green/yellow discharge around incision site)      Call MD for:  difficulty breathing, headache or visual disturbances      Discharge instructions      Comments:   Diet: Bland diet.       Medication List     As of 09/29/2012  3:00 PM    STOP  taking these medications         clindamycin 300 MG capsule   Commonly known as: CLEOCIN      TAKE these medications         amoxicillin-clavulanate 875-125 MG per tablet   Commonly known as: AUGMENTIN   Take 1 tablet by mouth 2 (two) times daily.      HYDROcodone-acetaminophen 5-325 MG per tablet   Commonly known as: NORCO/VICODIN   Take 1-2 tablets by mouth every 4 (four) hours as needed for pain.      levothyroxine 150 MCG tablet   Commonly known as: SYNTHROID, LEVOTHROID   Take 150 mcg by mouth daily.      metroNIDAZOLE 500 MG tablet   Commonly known as: FLAGYL   Take 1 tablet (500 mg total) by mouth 4 (four) times daily.      naproxen 250 MG tablet   Commonly known as: NAPROSYN   Take 250 mg by mouth 2 (two) times daily with a meal.      nebivolol 5 MG tablet   Commonly known as: BYSTOLIC   Take 5 mg by mouth daily.        Follow-up Information    Schedule an appointment as soon as possible for a visit with Dixon,CHRISTOPHER L, DDS. (Call on 1/27 for appointment to be seen same day or 1/28)    Contact information:   [address] [place], [address] [place] [address] [phone]       Schedule an appointment as soon as possible for a visit with Lolita Patella, MD.   Contact information:   [facility] [address] [place] [address] [phone]           The results of significant diagnostics from this hospitalization (including imaging, microbiology, ancillary and laboratory) are listed below for reference.    Significant Diagnostic Studies: Dg Orthopantogram  09/25/2012  *RADIOLOGY REPORT*  Clinical Data: Right lower dental abscess.  ORTHOPANTOGRAM/PANORAMIC  Comparison: CT of the neck on 09/25/2012  Findings: The right lower first molar has a root canal.  There is lucency below the tooth crown that is concerning for infection or abscess.  There is also periapical lucency around this tooth which was seen  on the recent CT examination. The patient has additional dental hardware.  The third molars are present.  IMPRESSION: There is periapical lucency around the right lower first molar. This also lucency underneath the crown.  Findings are concerning for infection.   Original Report Authenticated By: Richarda Overlie, M.D.    Ct Soft Tissue Neck W Contrast  09/25/2012   *RADIOLOGY REPORT*  Clinical Data: Right facial swelling and difficulty swallowing. Root canal last Tuesday.  CT NECK WITH CONTRAST  Technique:  Multidetector CT imaging of the neck was performed with intravenous contrast.  Contrast: 75mL OMNIPAQUE IOHEXOL 300 MG/ML  SOLN  Comparison: None.  Findings: The patient has a perimandibular abscess medial and lateral to the posterior right mandibular body extending toward the  angle of the mandible.  This is adjacent to the roots of tooth number three.  There is a small defect in the cortex of the mandible medially at the anterior root of that tooth.  The patient has impacted bilateral mandibular and maxillary third molars.  There is soft tissue swelling of the overlying masseter muscle with soft tissue stranding in the cheek consistent with cellulitis. Reactive submandibular adenopathy on the right.  Right submandibular gland is normal.  Right parotid gland is normal.  The visualized portions of paranasal sinuses and mastoid air cells are normal.  The visualized portion of the brain is normal.  Orbits are normal.  No prevertebral soft tissue swelling.  Epiglottis is normal.  No enlargement of the adenoids.  Lingual tonsils are normal.  IMPRESSION: Right perimandibular abscess, probably originating from the periapical abscesses at the roots of tooth #3. The abscess measures approximately 2 x 3 cm.  Adjacent cellulitis, myositis and reactive adenopathy.   Original Report Authenticated By: Francene Boyers, M.D.     Microbiology: Recent Results (from the past 240 hour(s))  CULTURE, BLOOD (ROUTINE X 2)     Status:  Normal (Preliminary result)   Collection Time   09/25/12  8:32 PM      Component Value Range Status Comment   Specimen Description BLOOD HAND RIGHT   Final    Special Requests BOTTLES DRAWN AEROBIC ONLY   Final    Culture  Setup Time 09/26/2012 02:04   Final    Culture     Final    Value:        BLOOD CULTURE RECEIVED NO GROWTH TO DATE CULTURE WILL BE HELD FOR 5 DAYS BEFORE ISSUING A FINAL NEGATIVE REPORT   Report Status PENDING   Incomplete   CULTURE, BLOOD (ROUTINE X 2)     Status: Normal (Preliminary result)   Collection Time   09/25/12  8:42 PM      Component Value Range Status Comment   Specimen Description BLOOD HAND LEFT   Final    Special Requests BOTTLES DRAWN AEROBIC ONLY   Final    Culture  Setup Time 09/26/2012 02:05   Final    Culture     Final    Value:        BLOOD CULTURE RECEIVED NO GROWTH TO DATE CULTURE WILL BE HELD FOR 5 DAYS BEFORE ISSUING A FINAL NEGATIVE REPORT   Report Status PENDING   Incomplete   SURGICAL PCR SCREEN     Status: Normal   Collection Time   09/27/12  4:00 AM      Component Value Range Status Comment   MRSA, PCR NEGATIVE  NEGATIVE Final    Staphylococcus aureus NEGATIVE  NEGATIVE Final      Labs: Basic Metabolic Panel:  Lab 09/27/12 1610 09/26/12 0630 09/25/12 1752  NA 139 138 138  K 3.6 3.5 3.4*  CL 103 102 98  CO2 27 25 28   GLUCOSE 113* 87 99  BUN 3* 7 7  CREATININE 0.54 0.63 0.65  CALCIUM 8.1* 8.3* 10.2  MG -- -- --  PHOS -- -- --   Liver Function Tests:  Lab 09/25/12 1752  AST 16  ALT 10  ALKPHOS 97  BILITOT 0.5  PROT 7.8  ALBUMIN 4.3   No results found for this basename: LIPASE:5,AMYLASE:5 in the last 168 hours No results found for this basename: AMMONIA:5 in the last 168 hours CBC:  Lab 09/28/12 0550 09/27/12 0625 09/26/12 0630 09/25/12 1752  WBC 8.5 8.3 9.1 11.0*  NEUTROABS -- -- -- 7.8*  HGB 10.9* 10.5* 11.7* 13.2  HCT 32.9* 31.9* 34.7* 38.5  MCV 89.4 89.1 88.7 87.5  PLT 287 259 286 336   Cardiac  Enzymes: No results found for this basename: CKTOTAL:5,CKMB:5,CKMBINDEX:5,TROPONINI:5 in the last 168 hours BNP: BNP (last 3 results) No results found for this basename: PROBNP:3 in the last 8760 hours CBG:  Lab 09/29/12 0803 09/29/12 0454 09/29/12 0013 09/28/12 2013 09/28/12 1650  GLUCAP 102* 115* 118* 113* 106*       Signed:  Bayden Gil  [facility] 09/29/2012, 3:00 PM

## 2012-09-30 ENCOUNTER — Encounter (HOSPITAL_COMMUNITY): Payer: Self-pay | Admitting: Oral and Maxillofacial Surgery

## 2012-10-02 LAB — CULTURE, BLOOD (ROUTINE X 2): Culture: NO GROWTH

## 2012-10-14 ENCOUNTER — Ambulatory Visit: Payer: BC Managed Care – PPO

## 2013-03-04 ENCOUNTER — Other Ambulatory Visit: Payer: Self-pay | Admitting: Endocrinology

## 2013-03-04 DIAGNOSIS — I10 Essential (primary) hypertension: Secondary | ICD-10-CM

## 2013-03-04 DIAGNOSIS — E890 Postprocedural hypothyroidism: Secondary | ICD-10-CM

## 2013-03-10 ENCOUNTER — Other Ambulatory Visit (INDEPENDENT_AMBULATORY_CARE_PROVIDER_SITE_OTHER): Payer: BC Managed Care – PPO

## 2013-03-10 DIAGNOSIS — I10 Essential (primary) hypertension: Secondary | ICD-10-CM

## 2013-03-10 DIAGNOSIS — E890 Postprocedural hypothyroidism: Secondary | ICD-10-CM

## 2013-03-10 LAB — BASIC METABOLIC PANEL
BUN: 15 mg/dL (ref 6–23)
CO2: 30 mEq/L (ref 19–32)
Calcium: 9.8 mg/dL (ref 8.4–10.5)
Chloride: 105 mEq/L (ref 96–112)
Creatinine, Ser: 0.8 mg/dL (ref 0.4–1.2)

## 2013-03-12 ENCOUNTER — Encounter: Payer: Self-pay | Admitting: Endocrinology

## 2013-03-12 ENCOUNTER — Ambulatory Visit (INDEPENDENT_AMBULATORY_CARE_PROVIDER_SITE_OTHER): Payer: BC Managed Care – PPO | Admitting: Endocrinology

## 2013-03-12 VITALS — BP 122/70 | HR 63 | Ht 65.25 in | Wt 144.6 lb

## 2013-03-12 DIAGNOSIS — E890 Postprocedural hypothyroidism: Secondary | ICD-10-CM | POA: Insufficient documentation

## 2013-03-12 NOTE — Progress Notes (Signed)
Patient ID: Glenda Cardenas, female   DOB: 07-09-1971, 42 y.o.   MRN: [id number] Reason for Appointment:  Hypothyroidism, new visit    History of Present Illness:   Complaints are reported by the patient initially were: exhaustion, cramps, coldness, sleepiness, difficulty concentrating. Also had tingling in her hands, cold intolerance and difficulty tasting food.   Recently the patient has been feeling fairly well without any unusual fatigue, however has been stressed because of her mother's death 2 weeks ago   The hypothyroidism presented following RAI treatment on 09/28/11.   The  previous TSH was performed On 09/02/12 and the result was 0.68,  before that On 04/16/12 it was 0.74; on 02/14/12, the result was 22.5. highest TSH 51.9 previously.   The response to therapy has been improved symptoms since her dose was increased from 112 up to 150 mcg.  Compliance with the medical regimen has been inconsistent recently and periodically may forget to take the medication in the morning. She will then not take it later in the day.         Lab Results  Component Value Date   TSH 3.92 03/10/2013     Medication List       This list is accurate as of: 03/12/13 10:56 AM.  Always use your most recent med list.               amoxicillin-clavulanate 875-125 MG per tablet  Commonly known as:  AUGMENTIN  Take 1 tablet by mouth 2 (two) times daily.     HYDROcodone-acetaminophen 5-325 MG per tablet  Commonly known as:  NORCO/VICODIN  Take 1-2 tablets by mouth every 4 (four) hours as needed for pain.     levothyroxine 150 MCG tablet  Commonly known as:  SYNTHROID, LEVOTHROID  Take 150 mcg by mouth daily.     metroNIDAZOLE 500 MG tablet  Commonly known as:  FLAGYL  Take 1 tablet (500 mg total) by mouth 4 (four) times daily.     naproxen 250 MG tablet  Commonly known as:  NAPROSYN  Take 250 mg by mouth 2 (two) times daily with a meal.     nebivolol 5 MG tablet  Commonly known as:  BYSTOLIC   Take 5 mg by mouth daily.             Past Medical History  Diagnosis Date  . Hypertension   . Thyroid disease     Past Surgical History  Procedure Laterality Date  . Radioactive iodine    . Tooth extraction  09/27/2012    Procedure: EXTRACTION MOLARS;  Surgeon: Francene Finders, DDS;  Location: [facility] OR;  Service: Oral Surgery;  Laterality: Right;  INTRAORAL, EXTRAORAL,,  IRRIGATION AND DEBRIDEMENT SURGICAL REMOVAL # 30     Family History  Problem Relation Age of Onset  . CAD Father     Social History:  reports that she has never smoked. She has never used smokeless tobacco. She reports that  drinks alcohol. She reports that she does not use illicit drugs.  Allergies: No Known Allergies  Review of Systems:  CARDIOLOGY: no history of high blood pressure.            GASTROENTEROLOGY:  no Change in bowel habits.      ENDOCRINOLOGY:  no history of Diabetes.             Examination:  BP 122/70  Pulse 63  Ht 5' 5.25" (1.657 m)  Wt 144 lb 9.6 oz (65.59  kg)  BMI 23.89 kg/m2  LMP 02/10/2013   General Appearance: pleasant, well-looking          Eyes: No proptosis or eyelid swelling .          Neurological: REFLEXES: at biceps are normal.           Assessments 1. Hyperthyroidism - 242.90 with normal TSH although it is now upper normal compared to previous levels and this is likely to be from relative noncompliance with taking her medication especially recently  Treatment:  No change in dosage. Emphasized the need to take the medication daily unit she forgets to take it in the morning    Glenda Cardenas 03/12/2013, 10:50 AM

## 2013-03-12 NOTE — Patient Instructions (Addendum)
Take Rx daily, ok to do later in day

## 2013-09-01 ENCOUNTER — Other Ambulatory Visit: Payer: Self-pay

## 2013-09-01 DIAGNOSIS — Z1231 Encounter for screening mammogram for malignant neoplasm of breast: Secondary | ICD-10-CM

## 2013-09-05 ENCOUNTER — Ambulatory Visit
Admission: RE | Admit: 2013-09-05 | Discharge: 2013-09-05 | Disposition: A | Discharge status: Home / Self Care | Payer: BC Managed Care – PPO | Source: Ambulatory Visit

## 2013-09-05 ENCOUNTER — Other Ambulatory Visit: Payer: Self-pay

## 2013-09-05 DIAGNOSIS — Z1231 Encounter for screening mammogram for malignant neoplasm of breast: Secondary | ICD-10-CM

## 2013-09-19 ENCOUNTER — Other Ambulatory Visit: Payer: Self-pay | Admitting: *Deleted

## 2013-09-19 ENCOUNTER — Other Ambulatory Visit (INDEPENDENT_AMBULATORY_CARE_PROVIDER_SITE_OTHER): Payer: BC Managed Care – PPO

## 2013-09-19 DIAGNOSIS — E876 Hypokalemia: Secondary | ICD-10-CM

## 2013-09-19 DIAGNOSIS — E039 Hypothyroidism, unspecified: Secondary | ICD-10-CM

## 2013-09-19 LAB — TSH: TSH: 0.22 u[IU]/mL — ABNORMAL LOW (ref 0.35–5.50)

## 2013-09-19 LAB — BASIC METABOLIC PANEL
BUN: 14 mg/dL (ref 6–23)
CO2: 26 mEq/L (ref 19–32)
Calcium: 9 mg/dL (ref 8.4–10.5)
Chloride: 105 mEq/L (ref 96–112)
Creatinine, Ser: 1 mg/dL (ref 0.4–1.2)
GFR: 66.41 mL/min (ref >60.00)
GLUCOSE: 101 mg/dL — AB (ref 70–99)
POTASSIUM: 3.7 meq/L (ref 3.5–5.1)
SODIUM: 137 meq/L (ref 135–145)

## 2013-09-19 LAB — T4, FREE: FREE T4: 1.46 ng/dL (ref 0.60–1.60)

## 2013-09-22 ENCOUNTER — Ambulatory Visit (INDEPENDENT_AMBULATORY_CARE_PROVIDER_SITE_OTHER): Payer: BC Managed Care – PPO | Admitting: Endocrinology

## 2013-09-22 ENCOUNTER — Encounter: Payer: Self-pay | Admitting: Endocrinology

## 2013-09-22 VITALS — BP 118/64 | HR 50 | Temp 98.3°F | Resp 12 | Ht 65.5 in | Wt 150.2 lb

## 2013-09-22 DIAGNOSIS — E890 Postprocedural hypothyroidism: Secondary | ICD-10-CM

## 2013-09-22 NOTE — Progress Notes (Addendum)
Patient ID: Glenda Cardenas, female   DOB: 1971-06-28, 43 y.o.   MRN: [id number]   Reason for Appointment:  Hypothyroidism, followup visit   History of Present Illness:   Her hypothyroidism presented following RAI treatment for Graves' disease on 09/28/11. She has been on various doses of thyroid supplements, ranging from 112 up to 150 mcg instead  When she was first diagnosed with hypothyroidism she had the following complaints:: exhaustion, cramps, coldness, sleepiness, difficulty concentrating. Also had tingling in her hands, cold intolerance and difficulty tasting food.   On her last visit in 03/2013 she had been somewhat inconsistent with taking her thyroid supplement and her TSH was high normal at 3.9 She however had not had unusual fatigue Recently the patient has been feeling fairly well without any unusual fatigue, weight change or unusual dry skin/hair loss Compliance with the medical regimen has been much better since her last visit and she is taking it very consistently in the morning except rarely. Does not take any calcium supplements or multivitamins at the same time  Labs: highest TSH 51.9 previously.        Lab Results  Component Value Date   TSH 0.22* 09/19/2013   TSH 3.92 03/10/2013   FREET4 1.46 09/19/2013      Medication List       This list is accurate as of: 09/22/13 10:12 AM.  Always use your most recent med list.               levothyroxine 150 MCG tablet  Commonly known as:  SYNTHROID, LEVOTHROID  Take 150 mcg by mouth daily.     naproxen 250 MG tablet  Commonly known as:  NAPROSYN  Take 250 mg by mouth 2 (two) times daily with a meal.     nebivolol 5 MG tablet  Commonly known as:  BYSTOLIC  Take 5 mg by mouth daily.             Past Medical History  Diagnosis Date  . Hypertension   . Thyroid disease     Past Surgical History  Procedure Laterality Date  . Radioactive iodine    . Tooth extraction  09/27/2012    Procedure: EXTRACTION  MOLARS;  Surgeon: Francene Finders, DDS;  Location: [facility] OR;  Service: Oral Surgery;  Laterality: Right;  INTRAORAL, EXTRAORAL,,  IRRIGATION AND DEBRIDEMENT SURGICAL REMOVAL # 30     Family History  Problem Relation Age of Onset  . CAD Father     Social History:  reports that she has never smoked. She has never used smokeless tobacco. She reports that she drinks alcohol. She reports that she does not use illicit drugs.  Allergies: No Known Allergies  Review of Systems:  CARDIOLOGY:  she has a  history of high blood pressure, currently followed by PCP. She is quite compliant with her Bystolic and blood pressure is well-controlled       ENDOCRINOLOGY:  no history of Diabetes.             Examination:  BP 118/64  Pulse 50  Temp(Src) 98.3 F (36.8 C)  Resp 12  Ht 5' 5.5" (1.664 m)  Wt 150 lb 3.2 oz (68.13 kg)  BMI 24.61 kg/m2  SpO2 99%  LMP 08/22/2013   General Appearance: pleasant, looks well      Eyes: No proptosis or eyelid swelling .                   Assessment:  Post  ablative hypothyroidism with relatively low TSH compared to previous levels. This is likely to be from her consistent compliance with taking her medication regularly Currently she is asymptomatic and looks euthyroid  History of hypokalemia: Resolved. Her lab glucose was 101, nonfasting  Plan:  She will reduce her dose by half a tablet weekly since she has a 90 day supply Emphasized the need to take her medication every morning regularly and avoid taking any calcium or iron containing preparations at the same time. Reminded her that this is a lifelong supplement The prescription will be changed to 137 mcg when she is running out. She will followup in 6 months change in dosage.    Melquan Ernsberger 09/22/2013, 10:12 AM

## 2013-09-22 NOTE — Patient Instructions (Signed)
Take 1/2 tab on Sundays

## 2013-11-10 ENCOUNTER — Other Ambulatory Visit: Payer: Self-pay | Admitting: *Deleted

## 2013-11-10 MED ORDER — LEVOTHYROXINE SODIUM 150 MCG PO TABS: 150.0000 ug | ORAL_TABLET | Freq: Every day | ORAL | Status: DC

## 2014-03-23 ENCOUNTER — Other Ambulatory Visit: Payer: BC Managed Care – PPO

## 2014-03-26 ENCOUNTER — Ambulatory Visit: Payer: BC Managed Care – PPO | Admitting: Endocrinology

## 2014-04-25 IMAGING — PX DG ORTHOPANTOGRAM /PANORAMIC
1 series · 1 of 1 positions shown · non-contrast
Comparison: CT of the neck on 09/25/2012

CLINICAL DATA: Right lower dental abscess.

ORTHOPANTOGRAM/PANORAMIC

[Series 1: — · U · 1 of 1 slices shown]
[im 1/1]
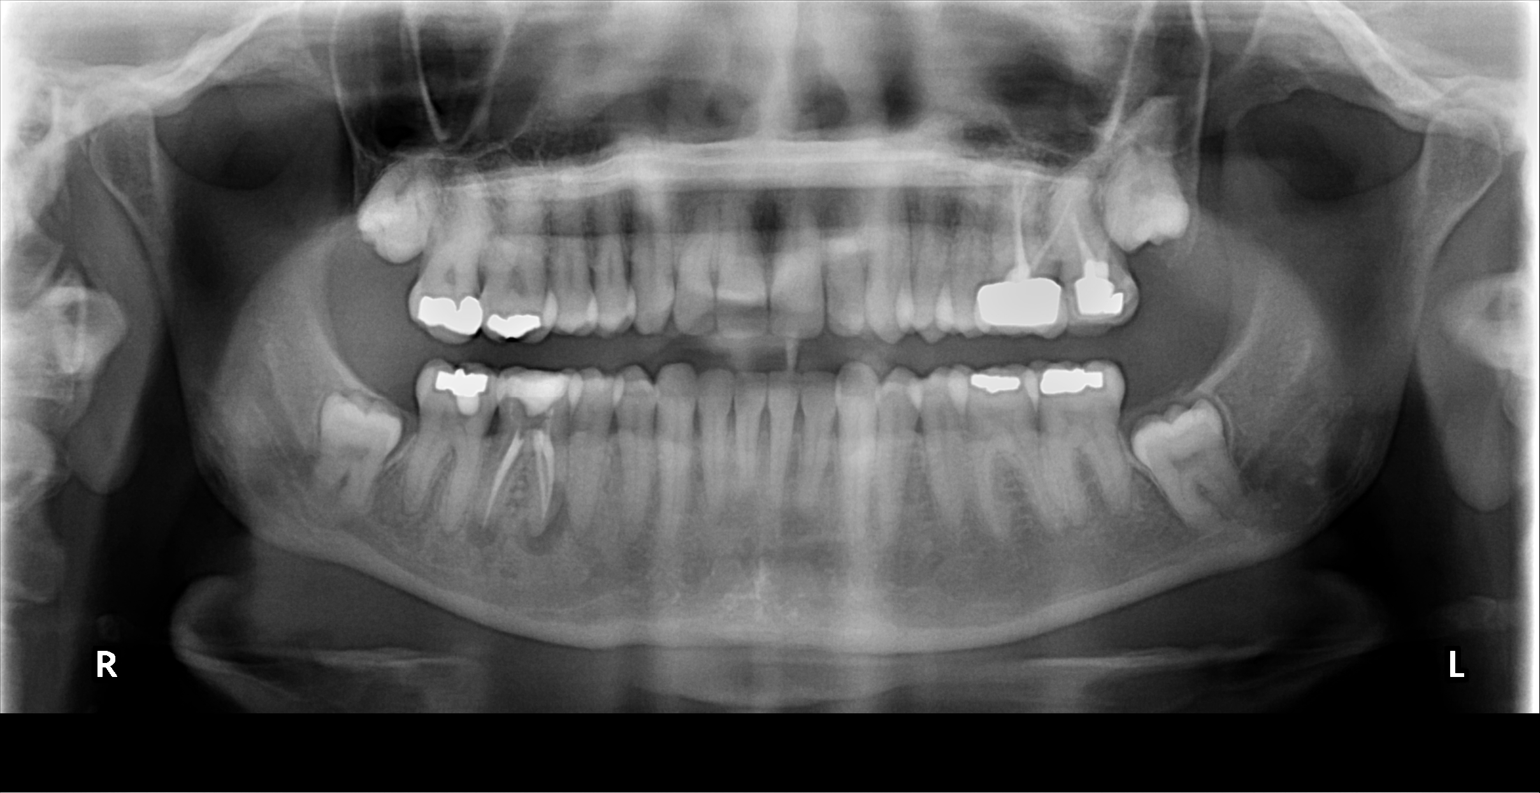

[1 of 1 positions shown; findings below may reference images not displayed]

FINDINGS: The right lower first molar has a root canal.  There is
lucency below the tooth crown that is concerning for infection or
abscess.  There is also periapical lucency around this tooth which
was seen on the recent CT examination. The patient has additional
dental hardware.  The third molars are present.
IMPRESSION: There is periapical lucency around the right lower first molar.
This also lucency underneath the crown.  Findings are concerning
for infection.

## 2014-08-25 ENCOUNTER — Other Ambulatory Visit (INDEPENDENT_AMBULATORY_CARE_PROVIDER_SITE_OTHER): Payer: BC Managed Care – PPO

## 2014-08-25 DIAGNOSIS — E890 Postprocedural hypothyroidism: Secondary | ICD-10-CM

## 2014-08-25 LAB — T4, FREE: FREE T4: 0.13 ng/dL — AB (ref 0.60–1.60)

## 2014-08-25 LAB — TSH: TSH: 35.09 u[IU]/mL — ABNORMAL HIGH (ref 0.35–4.50)

## 2014-08-26 ENCOUNTER — Other Ambulatory Visit: Payer: Self-pay | Admitting: *Deleted

## 2014-08-26 ENCOUNTER — Telehealth: Payer: Self-pay | Admitting: Endocrinology

## 2014-08-26 MED ORDER — LEVOTHYROXINE SODIUM 150 MCG PO TABS: 150.0000 ug | ORAL_TABLET | Freq: Every day | ORAL | Status: DC

## 2014-08-26 NOTE — Telephone Encounter (Signed)
Pt needs refill rx on generic synthyroid.

## 2014-08-26 NOTE — Telephone Encounter (Signed)
rx sent for 6 tablets, patient was due for office visit in July.  I sent enough to get her through until her appointment on the 29th.

## 2014-09-01 ENCOUNTER — Other Ambulatory Visit: Payer: Self-pay | Admitting: *Deleted

## 2014-09-01 ENCOUNTER — Ambulatory Visit (INDEPENDENT_AMBULATORY_CARE_PROVIDER_SITE_OTHER): Payer: BC Managed Care – PPO | Admitting: Endocrinology

## 2014-09-01 ENCOUNTER — Encounter: Payer: Self-pay | Admitting: Endocrinology

## 2014-09-01 VITALS — BP 141/79 | HR 51 | Temp 98.6°F | Resp 14 | Ht 65.5 in | Wt 164.4 lb

## 2014-09-01 DIAGNOSIS — E890 Postprocedural hypothyroidism: Secondary | ICD-10-CM

## 2014-09-01 MED ORDER — LEVOTHYROXINE SODIUM 137 MCG PO TABS: 137.0000 ug | ORAL_TABLET | Freq: Every day | ORAL | Status: DC

## 2014-09-01 NOTE — Patient Instructions (Signed)
Take vitamin at dinner

## 2014-09-01 NOTE — Progress Notes (Signed)
Patient ID: Glenda Cardenas, female   DOB: 05/14/1971, 43 y.o.   MRN: [id number]   Reason for Appointment:  Hypothyroidism, followup visit   History of Present Illness:   Her hypothyroidism presented following RAI treatment for Graves' disease on 09/28/11. She has been on various doses of thyroid supplements, ranging from 112 up to 150 mcg instead  When she was first diagnosed with hypothyroidism she had the following complaints: exhaustion, cramps, coldness, sleepiness, difficulty concentrating. Also had tingling in her hands, cold intolerance and difficulty tasting food.   On her last visit in 09/2013 she had been very compliant with her thyroid supplement, better than previously and her TSH was relatively low at 0.22 She was asymptomatic She was told to take 6-1/2 tablets a week and then switch to 137 g after her supply was finished  She did not come back for follow-up as scheduled in June Also because of various personal issues she did not refill her medication in 07/2014 She had been out of medication for about a month when she called for refill about a week ago More recently has been feeling significantly fatigued and has gained weight.  No cold intolerance or difficulty consultation Does take a multivitamin in the morning with her thyroid supplement  Labs: highest TSH 51.9 previously.        Lab Results  Component Value Date   TSH 35.09* 08/25/2014   TSH 0.22* 09/19/2013   TSH 3.92 03/10/2013   FREET4 0.13* 08/25/2014   FREET4 1.46 09/19/2013      Medication List       This list is accurate as of: 09/01/14 11:25 AM.  Always use your most recent med list.               amoxicillin 500 MG capsule  Commonly known as:  AMOXIL     levothyroxine 150 MCG tablet  Commonly known as:  SYNTHROID, LEVOTHROID  Take 1 tablet (150 mcg total) by mouth daily.     naproxen 250 MG tablet  Commonly known as:  NAPROSYN  Take 250 mg by mouth 2 (two) times daily with a meal.      nebivolol 5 MG tablet  Commonly known as:  BYSTOLIC  Take 5 mg by mouth daily.             Past Medical History  Diagnosis Date  . Hypertension   . Thyroid disease     Past Surgical History  Procedure Laterality Date  . Radioactive iodine    . Tooth extraction  09/27/2012    Procedure: EXTRACTION MOLARS;  Surgeon: Francene Findershristopher L [place], DDS;  [facility];  Service: Oral Surgery;  Laterality: Right;  INTRAORAL, EXTRAORAL,,  IRRIGATION AND DEBRIDEMENT SURGICAL REMOVAL # 30     Family History  Problem Relation Age of Onset  . CAD Father     Social History:  reports that she has never smoked. She has never used smokeless tobacco. She reports that she drinks alcohol. She reports that she does not use illicit drugs.  Allergies: No Known Allergies  Review of Systems:  CARDIOLOGY:  she has a  history of high blood pressure, currently followed by PCP. She is quite compliant with her Bystolic and blood pressure is well-controlled       No  history of Diabetes.      She has gained weight recently:  Wt Readings from Last 3 Encounters:  09/01/14 164 lb 6.4 oz (74.571 kg)  09/22/13 150 lb 3.2  oz (68.13 kg)  03/12/13 144 lb 9.6 oz (65.59 kg)           Examination:  BP 141/79 mmHg  Pulse 51  Temp(Src) 98.6 F (37 C)  Resp 14  Ht 5' 5.5" (1.664 m)  Wt 164 lb 6.4 oz (74.571 kg)  BMI 26.93 kg/m2  SpO2 98%   General Appearance: pleasant, looks well      Eyes: No swelling Biceps reflexes show slow relaxation Skin is not unusually dry No peripheral edema                  Assessment:  Post ablative hypothyroidism with recent noncompliance with her medication She is also significantly fatigued and has gained weight  Plan:   She will start taking 137 g from now on Emphasized the need to take the medication regularly She will take her multivitamin later in the day Follow-up in 2 months   Glenda Cardenas 09/01/2014, 11:25 AM

## 2014-10-28 ENCOUNTER — Other Ambulatory Visit: Payer: BC Managed Care – PPO

## 2014-11-02 ENCOUNTER — Ambulatory Visit: Payer: BC Managed Care – PPO | Admitting: Endocrinology

## 2014-11-18 ENCOUNTER — Other Ambulatory Visit: Payer: BC Managed Care – PPO

## 2014-11-30 ENCOUNTER — Other Ambulatory Visit (INDEPENDENT_AMBULATORY_CARE_PROVIDER_SITE_OTHER): Payer: BC Managed Care – PPO

## 2014-11-30 ENCOUNTER — Ambulatory Visit: Payer: BC Managed Care – PPO | Admitting: Endocrinology

## 2014-11-30 DIAGNOSIS — E890 Postprocedural hypothyroidism: Secondary | ICD-10-CM

## 2014-11-30 LAB — T4, FREE: FREE T4: 1.15 ng/dL (ref 0.60–1.60)

## 2014-11-30 LAB — TSH: TSH: 1.98 u[IU]/mL (ref 0.35–4.50)

## 2014-12-03 ENCOUNTER — Ambulatory Visit (INDEPENDENT_AMBULATORY_CARE_PROVIDER_SITE_OTHER): Payer: BC Managed Care – PPO | Admitting: Endocrinology

## 2014-12-03 ENCOUNTER — Encounter: Payer: Self-pay | Admitting: Endocrinology

## 2014-12-03 VITALS — BP 120/74 | HR 71 | Temp 97.7°F | Resp 14 | Ht 65.5 in | Wt 159.8 lb

## 2014-12-03 DIAGNOSIS — E890 Postprocedural hypothyroidism: Secondary | ICD-10-CM

## 2014-12-03 NOTE — Progress Notes (Signed)
Patient ID: Glenda HouseElizabeth A Cardenas, female   DOB: November 23, 1970, 44 y.o.   MRN: [id number]   Reason for Appointment:  Hypothyroidism, followup visit   History of Present Illness:   Her hypothyroidism presented following RAI treatment for Graves' disease on 09/28/11. She has been on various doses of thyroid supplements, ranging from 112 up to 150 mcg instead  When she was first diagnosed with hypothyroidism she had the following complaints: exhaustion, cramps, coldness, sleepiness, difficulty concentrating. Also had tingling in her hands, cold intolerance and difficulty tasting food.   On her last visit in 09/2013 she had been very compliant with her thyroid supplement, better than previously and her TSH was relatively low at 0.22 She was asymptomatic She was told to take 6-1/2 tablets a week and then switch to 137 g after her supply was finished Because of various personal issues she did not refill her medication in 07/2014 and became significantly hypothyroid when seen in 12/15  Since then her prescription has been called in and she is taking 137 g daily consistently She feels much better now and has no unusual fatigue.  Does not think she had any other associated symptoms of hypothyroidism She has also followed instructions to take a multivitamin later in the day  Her TSH is now back to normal  Labs: highest TSH 51.9 previously.        Lab Results  Component Value Date   TSH 1.98 11/30/2014   TSH 35.09* 08/25/2014   TSH 0.22* 09/19/2013   FREET4 1.15 11/30/2014   FREET4 0.13* 08/25/2014   FREET4 1.46 09/19/2013      Medication List       This list is accurate as of: 12/03/14  9:31 AM.  Always use your most recent med list.               amoxicillin 500 MG capsule  Commonly known as:  AMOXIL     levothyroxine 137 MCG tablet  Commonly known as:  SYNTHROID, LEVOTHROID  Take 1 tablet (137 mcg total) by mouth daily before breakfast.     naproxen 250 MG tablet  Commonly known  as:  NAPROSYN  Take 250 mg by mouth 2 (two) times daily with a meal.     nebivolol 5 MG tablet  Commonly known as:  BYSTOLIC  Take 5 mg by mouth daily.             Past Medical History  Diagnosis Date  . Hypertension   . Thyroid disease     Past Surgical History  Procedure Laterality Date  . Radioactive iodine    . Tooth extraction  09/27/2012    Procedure: EXTRACTION MOLARS;  Surgeon: Francene Findershristopher L [place], DDS;  [facility];  Service: Oral Surgery;  Laterality: Right;  INTRAORAL, EXTRAORAL,,  IRRIGATION AND DEBRIDEMENT SURGICAL REMOVAL # 30     Family History  Problem Relation Age of Onset  . CAD Father   . Hypertension Father   . Hypertension Mother   . Thyroid disease Neg Hx     Social History:  reports that she has never smoked. She has never used smokeless tobacco. She reports that she drinks alcohol. She reports that she does not use illicit drugs.  Allergies: No Known Allergies  Review of Systems:  She has a  history of high blood pressure, currently followed by PCP. She is quite compliant with her Bystolic and blood pressure is well-controlled       No  history of Diabetes.  She has lost weight recently:  Wt Readings from Last 3 Encounters:  12/03/14 159 lb 12.8 oz (72.485 kg)  09/01/14 164 lb 6.4 oz (74.571 kg)  09/22/13 150 lb 3.2 oz (68.13 kg)           Examination:  BP 120/74 mmHg  Pulse 71  Temp(Src) 97.7 F (36.5 C)  Resp 14  Ht 5' 5.5" (1.664 m)  Wt 159 lb 12.8 oz (72.485 kg)  BMI 26.18 kg/m2  SpO2 97%  She looks well      Eyes:  externally normal Biceps reflexes show normal relaxation Skin is not unusually dry                  Assessment:  Post ablative hypothyroidism with better compliance with her medication recently and also avoiding any interacting vitamins Subjectively also she is better with taking her medication consistently and understands the need for regular treatment  Plan:   She will continue taking 137 g in  the mornings Follow-up of very 6 months for now  [facility] 12/03/2014, 9:31 AM

## 2015-01-05 ENCOUNTER — Other Ambulatory Visit: Payer: Self-pay

## 2015-01-05 DIAGNOSIS — Z1231 Encounter for screening mammogram for malignant neoplasm of breast: Secondary | ICD-10-CM

## 2015-01-19 ENCOUNTER — Other Ambulatory Visit: Payer: Self-pay

## 2015-01-19 ENCOUNTER — Ambulatory Visit
Admission: RE | Admit: 2015-01-19 | Discharge: 2015-01-19 | Disposition: A | Discharge status: Home / Self Care | Payer: BC Managed Care – PPO | Source: Ambulatory Visit

## 2015-01-19 DIAGNOSIS — Z1231 Encounter for screening mammogram for malignant neoplasm of breast: Secondary | ICD-10-CM

## 2015-03-23 ENCOUNTER — Other Ambulatory Visit: Payer: Self-pay | Admitting: Endocrinology

## 2015-04-01 ENCOUNTER — Other Ambulatory Visit: Payer: Self-pay | Admitting: Endocrinology

## 2015-05-31 ENCOUNTER — Encounter: Payer: Self-pay | Admitting: Endocrinology

## 2015-06-01 ENCOUNTER — Other Ambulatory Visit: Payer: BC Managed Care – PPO

## 2015-06-04 ENCOUNTER — Ambulatory Visit: Payer: BC Managed Care – PPO | Admitting: Endocrinology

## 2015-06-17 ENCOUNTER — Other Ambulatory Visit: Payer: Self-pay | Admitting: Endocrinology

## 2015-09-15 ENCOUNTER — Other Ambulatory Visit: Payer: Self-pay | Admitting: Endocrinology

## 2015-10-01 ENCOUNTER — Other Ambulatory Visit: Payer: BC Managed Care – PPO

## 2015-10-06 ENCOUNTER — Ambulatory Visit: Payer: BC Managed Care – PPO | Admitting: Endocrinology

## 2015-10-08 ENCOUNTER — Other Ambulatory Visit (INDEPENDENT_AMBULATORY_CARE_PROVIDER_SITE_OTHER): Payer: BC Managed Care – PPO

## 2015-10-08 DIAGNOSIS — E890 Postprocedural hypothyroidism: Secondary | ICD-10-CM

## 2015-10-08 LAB — T4, FREE: FREE T4: 0.67 ng/dL (ref 0.60–1.60)

## 2015-10-08 LAB — TSH: TSH: 23.12 u[IU]/mL — AB (ref 0.35–4.50)

## 2015-10-11 ENCOUNTER — Other Ambulatory Visit: Payer: Self-pay | Admitting: Endocrinology

## 2015-10-11 ENCOUNTER — Encounter: Payer: Self-pay | Admitting: Endocrinology

## 2015-10-11 ENCOUNTER — Ambulatory Visit (INDEPENDENT_AMBULATORY_CARE_PROVIDER_SITE_OTHER): Payer: BC Managed Care – PPO | Admitting: Endocrinology

## 2015-10-11 VITALS — BP 126/80 | HR 76 | Temp 98.9°F | Resp 14 | Ht 65.5 in | Wt 159.4 lb

## 2015-10-11 DIAGNOSIS — E890 Postprocedural hypothyroidism: Secondary | ICD-10-CM

## 2015-10-11 NOTE — Progress Notes (Signed)
Patient ID: Glenda Cardenas, female   DOB: [birth year], 45 y.o.   MRN: [id number]   Reason for Appointment:  Hypothyroidism, followup visit   History of Present Illness:   Her hypothyroidism presented following RAI treatment for Graves' disease on 09/28/11. She has been on various doses of thyroid supplements, ranging from 112 up to 150 mcg instead  When she was first diagnosed with hypothyroidism she had the following complaints: exhaustion, cramps, coldness, sleepiness, difficulty concentrating. Also had tingling in her hands, cold intolerance and difficulty tasting food.  She has been on relatively stable dose in the last 2 years also Because of various personal issues she did not refill her medication in 07/2014 and became significantly hypothyroid when seen in 12/15  Since then her thyroid level had been normal with the 137 g She is taking this daily  However she ran out of her medication about a week or so ago and is starting to feel fatigued and cold  She has also followed instructions to take a multivitamin later in the day  Her TSH is now higher as expected  Labs: highest TSH 51.9 previously.        Lab Results  Component Value Date   TSH 23.12* 10/08/2015   TSH 1.98 11/30/2014   TSH 35.09* 08/25/2014   FREET4 0.67 10/08/2015   FREET4 1.15 11/30/2014   FREET4 0.13* 08/25/2014      Medication List       This list is accurate as of: 10/11/15  4:13 PM.  Always use your most recent med list.               levothyroxine 137 MCG tablet  Commonly known as:  SYNTHROID, LEVOTHROID  TAKE 1 TABLET BY MOUTH EVERY DAY BEFORE BREAKFAST     nebivolol 5 MG tablet  Commonly known as:  BYSTOLIC  Take 5 mg by mouth daily.             Past Medical History  Diagnosis Date  . Hypertension   . Thyroid disease     Past Surgical History  Procedure Laterality Date  . Radioactive iodine    . Tooth extraction  09/27/2012    Procedure: EXTRACTION MOLARS;   Surgeon: Francene Finders, DDS;  Location: [facility];  Service: Oral Surgery;  Laterality: Right;  INTRAORAL, EXTRAORAL,,  IRRIGATION AND DEBRIDEMENT SURGICAL REMOVAL # 30     Family History  Problem Relation Age of Onset  . CAD Father   . Hypertension Father   . Hypertension Mother   . Thyroid disease Neg Hx     Social History:  reports that she has never smoked. She has never used smokeless tobacco. She reports that she drinks alcohol. She reports that she does not use illicit drugs.  Allergies: No Known Allergies  Review of Systems:  She has a  history of high blood pressure, currently followed by PCP. She is quite compliant with her Bystolic and blood pressure is well-controlled        Weight history:  Wt Readings from Last 3 Encounters:  10/11/15 159 lb 6.4 oz (72.303 kg)  12/03/14 159 lb 12.8 oz (72.485 kg)  09/01/14 164 lb 6.4 oz (74.571 kg)           Examination:  BP 126/80 mmHg  Pulse 76  Temp(Src) 98.9 F (37.2 C)  Resp 14  Ht 5' 5.5" (1.664 m)  Wt 159 lb 6.4 oz (72.303 kg)  BMI 26.11 kg/m2  SpO2  96%  Minimal puffiness of the face present   Eyes:  externally normal Biceps reflexes show slow relaxation Skin is not unusually dry                  Assessment:  Post ablative hypothyroidism with previously normal levels taking 137 g She had also been feeling fairly good until she ran out of medication TSH is high as expected  Plan:   She will continue taking 137 g in the mornings Will check her TSH again in 6 weeks to make sure her dose is accurate otherwise follow-up in 12 months  Manvi Guilliams 10/11/2015, 4:13 PM

## 2015-11-22 ENCOUNTER — Other Ambulatory Visit (INDEPENDENT_AMBULATORY_CARE_PROVIDER_SITE_OTHER): Payer: BC Managed Care – PPO

## 2015-11-22 DIAGNOSIS — E890 Postprocedural hypothyroidism: Secondary | ICD-10-CM | POA: Diagnosis not present

## 2015-11-22 LAB — TSH: TSH: 2.51 u[IU]/mL (ref 0.35–4.50)

## 2015-11-22 NOTE — Progress Notes (Signed)
Quick Note:  Please let patient know that the lab result is normal and no further action needed, see in 1 year ______

## 2016-03-20 ENCOUNTER — Other Ambulatory Visit: Payer: Self-pay | Admitting: Family Medicine

## 2016-03-20 DIAGNOSIS — Z1231 Encounter for screening mammogram for malignant neoplasm of breast: Secondary | ICD-10-CM

## 2016-03-23 ENCOUNTER — Ambulatory Visit
Admission: RE | Admit: 2016-03-23 | Discharge: 2016-03-23 | Disposition: A | Discharge status: Home / Self Care | Payer: BC Managed Care – PPO | Source: Ambulatory Visit | Attending: Family Medicine | Admitting: Family Medicine

## 2016-03-23 DIAGNOSIS — Z1231 Encounter for screening mammogram for malignant neoplasm of breast: Secondary | ICD-10-CM

## 2016-10-29 ENCOUNTER — Other Ambulatory Visit: Payer: Self-pay | Admitting: Endocrinology

## 2016-11-15 ENCOUNTER — Telehealth: Payer: Self-pay | Admitting: Endocrinology

## 2016-11-15 NOTE — Telephone Encounter (Signed)
LM for pt to call back to schedule follow up with labs

## 2016-11-28 NOTE — Telephone Encounter (Signed)
LM for pt to call back to schedule FU and Labs

## 2017-02-08 ENCOUNTER — Telehealth: Payer: Self-pay | Admitting: Endocrinology

## 2017-02-08 NOTE — Telephone Encounter (Signed)
Please advise if okay to refill? Patient did not come back for her follow up in 10/2016.

## 2017-02-08 NOTE — Telephone Encounter (Signed)
30 day prescription with note to make an appointment

## 2017-02-08 NOTE — Telephone Encounter (Signed)
I have refilled for 30 day with no refill and note to make an appointment for further refills.

## 2017-03-14 ENCOUNTER — Other Ambulatory Visit: Payer: Self-pay | Admitting: Family Medicine

## 2017-03-14 DIAGNOSIS — Z1231 Encounter for screening mammogram for malignant neoplasm of breast: Secondary | ICD-10-CM

## 2017-03-26 ENCOUNTER — Other Ambulatory Visit: Payer: Self-pay | Admitting: Endocrinology

## 2017-03-29 ENCOUNTER — Ambulatory Visit
Admission: RE | Admit: 2017-03-29 | Discharge: 2017-03-29 | Disposition: A | Discharge status: Home / Self Care | Payer: BC Managed Care – PPO | Source: Ambulatory Visit | Attending: Family Medicine | Admitting: Family Medicine

## 2017-03-29 DIAGNOSIS — Z1231 Encounter for screening mammogram for malignant neoplasm of breast: Secondary | ICD-10-CM

## 2017-04-02 ENCOUNTER — Other Ambulatory Visit: Payer: Self-pay | Admitting: Endocrinology

## 2017-04-02 ENCOUNTER — Other Ambulatory Visit (INDEPENDENT_AMBULATORY_CARE_PROVIDER_SITE_OTHER): Payer: BC Managed Care – PPO

## 2017-04-02 DIAGNOSIS — E890 Postprocedural hypothyroidism: Secondary | ICD-10-CM

## 2017-04-02 LAB — TSH: TSH: 10.66 u[IU]/mL — ABNORMAL HIGH (ref 0.35–4.50)

## 2017-04-02 LAB — T4, FREE: Free T4: 0.76 ng/dL (ref 0.60–1.60)

## 2017-04-04 ENCOUNTER — Ambulatory Visit (INDEPENDENT_AMBULATORY_CARE_PROVIDER_SITE_OTHER): Payer: BC Managed Care – PPO | Admitting: Endocrinology

## 2017-04-04 ENCOUNTER — Encounter: Payer: Self-pay | Admitting: Endocrinology

## 2017-04-04 VITALS — BP 140/82 | HR 64 | Ht 65.5 in | Wt 158.2 lb

## 2017-04-04 DIAGNOSIS — E890 Postprocedural hypothyroidism: Secondary | ICD-10-CM

## 2017-04-04 MED ORDER — LEVOTHYROXINE SODIUM 137 MCG PO TABS: ORAL_TABLET | ORAL | 2 refills | Status: DC

## 2017-04-04 NOTE — Progress Notes (Signed)
Patient ID: Glenda Cardenas, female   DOB: 1971-08-12, 46 y.o.   MRN: [id number]   Reason for Appointment:  Hypothyroidism, followup visit   History of Present Illness:   Her hypothyroidism presented following RAI treatment for Graves' disease on 09/28/11. She has been on various doses of thyroid supplements, ranging from 112 up to 150 mcg instead  When she was first diagnosed with hypothyroidism she had the following complaints: exhaustion, cramps, coldness, sleepiness, difficulty concentrating. Also had tingling in her hands, cold intolerance and difficulty tasting food.  She has been on relatively stable dose in the last 2 years also Because of various personal issues she did not refill her medication in 07/2014 and became significantly hypothyroid when seen in 12/15 Labs: highest TSH 51.9 previously.  Since then her thyroid level had been normal with the 137 g  RECENT history: She again has missed her appointment for follow-up this year and she did not have any medication for 5 days prior to her labs this week With taking her thyroid medication regularly she has normal energy level and no coldness When she is not taking her medication she does start feeling more tired Otherwise she is taking her Synthroid daily in the morning before breakfast without any vitamins at the same time  Her TSH is now higher as expected    Lab Results  Component Value Date   TSH 10.66 (H) 04/02/2017   TSH 2.51 11/22/2015   TSH 23.12 (H) 10/08/2015   FREET4 0.76 04/02/2017   FREET4 0.67 10/08/2015   FREET4 1.15 11/30/2014    Allergies as of 04/04/2017   No Known Allergies     Medication List       Accurate as of 04/04/17  9:26 AM. Always use your most recent med list.          levothyroxine 137 MCG tablet Commonly known as:  SYNTHROID, LEVOTHROID TAKE 1 TABLET BY MOUTH EVERY DAY BEFORE BREAKFAST   lisinopril 10 MG tablet Commonly known as:  PRINIVIL,ZESTRIL Take 10 mg by  mouth daily.            Past Medical History:  Diagnosis Date  . Hypertension   . Thyroid disease     Past Surgical History:  Procedure Laterality Date  . radioactive iodine    . TOOTH EXTRACTION  09/27/2012   Procedure: EXTRACTION MOLARS;  Surgeon: Francene Findershristopher L [place], DDS;  [facility];  Service: Oral Surgery;  Laterality: Right;  INTRAORAL, EXTRAORAL,,  IRRIGATION AND DEBRIDEMENT SURGICAL REMOVAL # 30     Family History  Problem Relation Age of Onset  . CAD Father   . Hypertension Father   . Hypertension Mother   . Thyroid disease Neg Hx     Social History:  reports that she has never smoked. She has never used smokeless tobacco. She reports that she drinks alcohol. She reports that she does not use drugs.  Allergies: No Known Allergies  Review of Systems:  She has a  history of high blood pressure, currently followed by PCP. Previously taking Bystolic now taking lisinopril, follows up with PCP regularly  No significant weight change recently:  Weight history:  Wt Readings from Last 3 Encounters:  04/04/17 158 lb 3.2 oz (71.8 kg)  10/11/15 159 lb 6.4 oz (72.3 kg)  12/03/14 159 lb 12.8 oz (72.5 kg)           Examination:  BP 140/82   Pulse 64   Ht 5' 5.5" (1.664  m)   Wt 158 lb 3.2 oz (71.8 kg)   SpO2 98%   BMI 25.93 kg/m   Neck exam shows no thyroid enlargement Biceps reflexes show normal relaxation Skin is not unusually dry                  Assessment:  Post ablative hypothyroidism with usually normal levels when taking 137 g regularly  Her TSH is high because of her running out of her medication She is again irregular with her follow-up No thyroid enlargement felt TSH is high as expected  Plan:   She will continue taking 137 g in the mornings Will check her TSH again in 6 weeks to make sure her dose is appropriate  If her TSH is normal she will follow-up in one year again Emphasized the need for regular follow-up  Total visit time  = 15 minutes Labron Bloodgood 04/04/2017, 9:26 AM

## 2017-05-16 ENCOUNTER — Other Ambulatory Visit: Payer: BC Managed Care – PPO

## 2017-05-23 ENCOUNTER — Other Ambulatory Visit (INDEPENDENT_AMBULATORY_CARE_PROVIDER_SITE_OTHER): Payer: BC Managed Care – PPO

## 2017-05-23 DIAGNOSIS — E890 Postprocedural hypothyroidism: Secondary | ICD-10-CM

## 2017-05-23 LAB — TSH: TSH: 0.17 u[IU]/mL — ABNORMAL LOW (ref 0.35–4.50)

## 2017-05-26 NOTE — Progress Notes (Signed)
Please call to let patient know that the thyroid level is now high She needs to change her prescription to 125 levothyroxine and come back to see me in 6 weeks with labs

## 2017-05-30 ENCOUNTER — Telehealth: Payer: Self-pay | Admitting: Endocrinology

## 2017-05-30 NOTE — Telephone Encounter (Signed)
Patient returning missed phone call from Megan. Please call patient and advise. °

## 2017-05-31 ENCOUNTER — Other Ambulatory Visit: Payer: Self-pay

## 2017-05-31 MED ORDER — LEVOTHYROXINE SODIUM 125 MCG PO TABS: ORAL_TABLET | ORAL | 2 refills | Status: DC

## 2017-05-31 NOTE — Telephone Encounter (Signed)
Patient calling in reference to note below. Patient stated she is out of levothyroxine (SYNTHROID, LEVOTHROID) 137 MCG tablet and needs this refilled today. 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 Please call patient and advise.  OK to leave detailed message on phone.

## 2017-05-31 NOTE — Telephone Encounter (Signed)
Called patient and gave her the lab results. I have sent in a new prescription for her 125 mcg of Levothyroxine. I have given her an appointment on 07-10-2017 for follow up and labs on 07-05-2017 for labs.

## 2017-07-05 ENCOUNTER — Other Ambulatory Visit: Payer: BC Managed Care – PPO

## 2017-07-10 ENCOUNTER — Ambulatory Visit: Payer: BC Managed Care – PPO | Admitting: Endocrinology

## 2017-08-16 ENCOUNTER — Other Ambulatory Visit: Payer: Self-pay | Admitting: Endocrinology

## 2017-09-05 ENCOUNTER — Telehealth: Payer: Self-pay | Admitting: Endocrinology

## 2017-09-05 ENCOUNTER — Other Ambulatory Visit: Payer: Self-pay

## 2017-09-05 MED ORDER — LEVOTHYROXINE SODIUM 125 MCG PO TABS: ORAL_TABLET | ORAL | 2 refills | Status: DC

## 2017-09-05 NOTE — Telephone Encounter (Signed)
This prescription has been sent to the pharmacy for the patient. 

## 2017-09-05 NOTE — Telephone Encounter (Signed)
Glenda HashimotoPatricia need prescription for    levothyroxine (SYNTHROID, LEVOTHROID) 125 MCG tablet [[id number]][[id number]]    Pharmacy:  [id number] - [place], [place] - [address]

## 2018-01-29 ENCOUNTER — Other Ambulatory Visit: Payer: Self-pay | Admitting: Endocrinology

## 2018-02-14 ENCOUNTER — Other Ambulatory Visit: Payer: Self-pay | Admitting: Endocrinology

## 2018-03-31 ENCOUNTER — Other Ambulatory Visit: Payer: Self-pay | Admitting: Endocrinology

## 2018-03-31 DIAGNOSIS — E890 Postprocedural hypothyroidism: Secondary | ICD-10-CM

## 2018-04-01 ENCOUNTER — Other Ambulatory Visit (INDEPENDENT_AMBULATORY_CARE_PROVIDER_SITE_OTHER): Payer: BC Managed Care – PPO

## 2018-04-01 DIAGNOSIS — E890 Postprocedural hypothyroidism: Secondary | ICD-10-CM | POA: Diagnosis not present

## 2018-04-01 LAB — TSH: TSH: 1.22 u[IU]/mL (ref 0.35–4.50)

## 2018-04-01 LAB — T4, FREE: Free T4: 0.99 ng/dL (ref 0.60–1.60)

## 2018-04-04 ENCOUNTER — Encounter: Payer: Self-pay | Admitting: Endocrinology

## 2018-04-04 ENCOUNTER — Ambulatory Visit: Payer: BC Managed Care – PPO | Admitting: Endocrinology

## 2018-04-04 VITALS — BP 138/80 | HR 78 | Ht 65.5 in | Wt 169.4 lb

## 2018-04-04 DIAGNOSIS — E890 Postprocedural hypothyroidism: Secondary | ICD-10-CM

## 2018-04-04 NOTE — Progress Notes (Signed)
Patient ID: Glenda Cardenas, female   DOB: [birth year], 47 y.o.   MRN: [id number]   Reason for Appointment:  Hypothyroidism, followup visit   History of Present Illness:   Her hypothyroidism presented following RAI treatment for Graves' disease on 09/28/11. She has been on various doses of thyroid supplements, ranging from 112 up to 150 mcg instead  When she was first diagnosed with hypothyroidism she had the following complaints: exhaustion, cramps, coldness, sleepiness, difficulty concentrating. Also had tingling in her hands, cold intolerance and difficulty tasting food.  She has been on relatively stable dose in the last 2 years also Because of various personal issues she did not refill her medication in 07/2014 and became significantly hypothyroid when seen in 12/15 Labs: highest TSH 51.9 previously.  Since then her thyroid level had been normal with the 137 g  RECENT history:  She tends to be a little irregular with her appointments for follow-up as recommended When she is not taking her medication she does start feeling more tired Recently she is taking her Synthroid daily in the morning before breakfast and then has 2 cups of coffee She is asking about starting a multivitamin  Although she was having a high TSH in 7/18 this was from her medicine and medication for 5 days Follow-up TSH was low and she was switched from 137 down to 125 mcg daily  She feels fairly good with no recent fatigue, cold intolerance or hair loss She has gained weight since last year   Her TSH is now quite normal at 1.2    Lab Results  Component Value Date   TSH 1.22 04/01/2018   TSH 0.17 (L) 05/23/2017   TSH 10.66 (H) 04/02/2017   FREET4 0.99 04/01/2018   FREET4 0.76 04/02/2017   FREET4 0.67 10/08/2015    Allergies as of 04/04/2018   No Known Allergies     Medication List        Accurate as of 04/04/18  9:22 AM. Always use your most recent med list.            levothyroxine 125 MCG tablet Commonly known as:  SYNTHROID, LEVOTHROID TAKE 1 TABLET BY MOUTH EVERY DAY BEFORE BREAKFAST   lisinopril 10 MG tablet Commonly known as:  PRINIVIL,ZESTRIL Take 10 mg by mouth daily.            Past Medical History:  Diagnosis Date  . Hypertension   . Thyroid disease     Past Surgical History:  Procedure Laterality Date  . radioactive iodine    . TOOTH EXTRACTION  09/27/2012   Procedure: EXTRACTION MOLARS;  Surgeon: Francene Findershristopher L [place], DDS;  [facility];  Service: Oral Surgery;  Laterality: Right;  INTRAORAL, EXTRAORAL,,  IRRIGATION AND DEBRIDEMENT SURGICAL REMOVAL # 30     Family History  Problem Relation Age of Onset  . CAD Father   . Hypertension Father   . Hypertension Mother   . Thyroid disease Neg Hx     Social History:  reports that she has never smoked. She has never used smokeless tobacco. She reports that she drinks alcohol. She reports that she does not use drugs.  Allergies: No Known Allergies  Review of Systems:  She has a  history of high blood pressure, followed by PCP. Previously taking Bystolic now taking lisinopril, follows up with PCP regularly   Weight history:  Wt Readings from Last 3 Encounters:  04/04/18 169 lb 6.4 oz (76.8 kg)  04/04/17 158 lb  3.2 oz (71.8 kg)  10/11/15 159 lb 6.4 oz (72.3 kg)           Examination:  BP 138/80 (BP Location: Left Arm, Patient Position: Sitting, Cuff Size: Normal)   Pulse 78   Ht 5' 5.5" (1.664 m)   Wt 169 lb 6.4 oz (76.8 kg)   SpO2 97%   BMI 27.76 kg/m   There is no thyroid enlargement Biceps reflexes show normal relaxation Skin is not unusually dry                  Assessment:  Post ablative hypothyroidism with some variability in her dosage requirement recently  She has been quite regular with her supplements However she is coming back after several months after her dosage change last year Subjectively doing well  TSH is perfectly normal now  Plan:    She will continue taking 125 mcg If she needs a multivitamin she can take that in the evening  Follow-up in 6 months  Makarios Madlock 04/04/2018, 9:22 AM

## 2018-09-10 ENCOUNTER — Other Ambulatory Visit: Payer: Self-pay | Admitting: Endocrinology

## 2018-10-07 ENCOUNTER — Other Ambulatory Visit (INDEPENDENT_AMBULATORY_CARE_PROVIDER_SITE_OTHER): Payer: BC Managed Care – PPO

## 2018-10-07 DIAGNOSIS — E890 Postprocedural hypothyroidism: Secondary | ICD-10-CM | POA: Diagnosis not present

## 2018-10-07 LAB — TSH: TSH: 0.2 u[IU]/mL — AB (ref 0.35–4.50)

## 2018-10-07 LAB — T4, FREE: Free T4: 1.29 ng/dL (ref 0.60–1.60)

## 2018-10-09 ENCOUNTER — Ambulatory Visit: Payer: BC Managed Care – PPO | Admitting: Endocrinology

## 2018-10-09 ENCOUNTER — Other Ambulatory Visit: Payer: Self-pay | Admitting: Family Medicine

## 2018-10-09 ENCOUNTER — Encounter: Payer: Self-pay | Admitting: Endocrinology

## 2018-10-09 VITALS — BP 140/70 | HR 71 | Ht 65.5 in | Wt 170.4 lb

## 2018-10-09 DIAGNOSIS — Z1231 Encounter for screening mammogram for malignant neoplasm of breast: Secondary | ICD-10-CM

## 2018-10-09 DIAGNOSIS — E890 Postprocedural hypothyroidism: Secondary | ICD-10-CM | POA: Diagnosis not present

## 2018-10-09 DIAGNOSIS — Z13220 Encounter for screening for lipoid disorders: Secondary | ICD-10-CM

## 2018-10-09 MED ORDER — LEVOTHYROXINE SODIUM 125 MCG PO TABS: ORAL_TABLET | ORAL | 1 refills | Status: DC

## 2018-10-09 NOTE — Patient Instructions (Signed)
Take 1/2 pill on Sundays 

## 2018-10-09 NOTE — Progress Notes (Signed)
Patient ID: Glenda Cardenas, female   DOB: 12-11-1970, 48 y.o.   MRN: [id number]   Reason for Appointment:  Hypothyroidism, followup visit   History of Present Illness:   Her hypothyroidism presented following RAI treatment for Graves' disease on 09/28/11. She has been on various doses of thyroid supplements, ranging from 112 up to 150 mcg instead  When she was first diagnosed with hypothyroidism she had the following complaints: exhaustion, cramps, coldness, sleepiness, difficulty concentrating. Also had tingling in her hands, cold intolerance and difficulty tasting food.  She has been on relatively stable dose in the last 2 years also Because of various personal issues she did not refill her medication in 07/2014 and became significantly hypothyroid when seen in 12/15 Labs: highest TSH 51.9 previously.  Since then her thyroid level had been normal with the 137 g  RECENT history:  She has done better with taking her Synthroid daily in the morning before breakfast However has been taking this with coffee and he is eating about an hour later  Not taking calcium with this or any multivitamin  She feels fairly good recently without any unusual fatigue Is having mild hot flashes from menopause Otherwise does not feel shaky or have any palpitations  She is still taking generic levothyroxine and does not think it looks any different  Weight is about the same   Her TSH is now slightly lower at 0.2, previously 1.2   Wt Readings from Last 3 Encounters:  10/09/18 170 lb 6.4 oz (77.3 kg)  04/04/18 169 lb 6.4 oz (76.8 kg)  04/04/17 158 lb 3.2 oz (71.8 kg)      Lab Results  Component Value Date   TSH 0.20 (L) 10/07/2018   TSH 1.22 04/01/2018   TSH 0.17 (L) 05/23/2017   FREET4 1.29 10/07/2018   FREET4 0.99 04/01/2018   FREET4 0.76 04/02/2017    Allergies as of 10/09/2018   No Known Allergies     Medication List       Accurate as of October 09, 2018 10:06 AM.  Always use your most recent med list.        levothyroxine 125 MCG tablet Commonly known as:  SYNTHROID, LEVOTHROID TAKE 1 TABLET BY MOUTH EVERY DAY BEFORE BREAKFAST            Past Medical History:  Diagnosis Date  . Hypertension   . Thyroid disease     Past Surgical History:  Procedure Laterality Date  . radioactive iodine    . TOOTH EXTRACTION  09/27/2012   Procedure: EXTRACTION MOLARS;  Surgeon: Francene Findershristopher L [place], DDS;  [facility];  Service: Oral Surgery;  Laterality: Right;  INTRAORAL, EXTRAORAL,,  IRRIGATION AND DEBRIDEMENT SURGICAL REMOVAL # 30     Family History  Problem Relation Age of Onset  . CAD Father   . Hypertension Father   . Hypertension Mother   . Thyroid disease Neg Hx     Social History:  reports that she has never smoked. She has never used smokeless tobacco. She reports current alcohol use. She reports that she does not use drugs.  Allergies: No Known Allergies  Review of Systems:  She has a  history of high blood pressure, followed by PCP. Previously taking Bystolic and subsequently lisinopril, currently not on any medication   She has had symptoms of menopause since 2019 , recently better and not on any HRT       Examination:  BP 140/70 (BP Location: Left Arm,  Patient Position: Sitting, Cuff Size: Normal)   Pulse 71   Ht 5' 5.5" (1.664 m)   Wt 170 lb 6.4 oz (77.3 kg)   SpO2 97%   BMI 27.92 kg/m   Thyroid not palpable Biceps reflexes show normal relaxation Skin is warm, no tremor                Assessment:  Post ablative hypothyroidism with some variability in her dosage requirement, has been on 137 mcg previously and now 125  She has been quite regular with her levothyroxine However even with her being more compliant with taking it before breakfast her TSH is not consistent and now slightly low She is asymptomatic Euthyroid on exam   Plan:   She will continue taking 125 mcg but take 1/2 pill on Sundays If she needs a  multivitamin she can take that in the evening  She will discuss management of menopause with gynecologist and follow-up with PCP for blood pressure Will screen for hyperlipidemia on the next visit  Follow-up in 6 months  Ekam Bonebrake 10/09/2018, 10:06 AM

## 2018-11-06 ENCOUNTER — Ambulatory Visit
Admission: RE | Admit: 2018-11-06 | Discharge: 2018-11-06 | Disposition: A | Discharge status: Home / Self Care | Payer: BC Managed Care – PPO | Source: Ambulatory Visit | Attending: Family Medicine | Admitting: Family Medicine

## 2018-11-06 DIAGNOSIS — Z1231 Encounter for screening mammogram for malignant neoplasm of breast: Secondary | ICD-10-CM

## 2019-04-07 ENCOUNTER — Other Ambulatory Visit (INDEPENDENT_AMBULATORY_CARE_PROVIDER_SITE_OTHER): Payer: BC Managed Care – PPO

## 2019-04-07 ENCOUNTER — Other Ambulatory Visit: Payer: Self-pay

## 2019-04-07 DIAGNOSIS — Z13220 Encounter for screening for lipoid disorders: Secondary | ICD-10-CM | POA: Diagnosis not present

## 2019-04-07 DIAGNOSIS — E890 Postprocedural hypothyroidism: Secondary | ICD-10-CM

## 2019-04-07 LAB — LIPID PANEL
Cholesterol: 255 mg/dL — ABNORMAL HIGH (ref 0–200)
HDL: 69.6 mg/dL (ref >39.00)
LDL Cholesterol: 154 mg/dL — ABNORMAL HIGH (ref 0–99)
NonHDL: 185.89
Total CHOL/HDL Ratio: 4
Triglycerides: 160 mg/dL — ABNORMAL HIGH (ref 0.0–149.0)
VLDL: 32 mg/dL (ref 0.0–40.0)

## 2019-04-07 LAB — T4, FREE: Free T4: 1.05 ng/dL (ref 0.60–1.60)

## 2019-04-07 LAB — TSH: TSH: 5.33 u[IU]/mL — ABNORMAL HIGH (ref 0.35–4.50)

## 2019-04-10 ENCOUNTER — Other Ambulatory Visit: Payer: Self-pay

## 2019-04-10 ENCOUNTER — Encounter: Payer: Self-pay | Admitting: Endocrinology

## 2019-04-10 ENCOUNTER — Ambulatory Visit (INDEPENDENT_AMBULATORY_CARE_PROVIDER_SITE_OTHER): Payer: BC Managed Care – PPO | Admitting: Endocrinology

## 2019-04-10 DIAGNOSIS — E890 Postprocedural hypothyroidism: Secondary | ICD-10-CM | POA: Diagnosis not present

## 2019-04-10 NOTE — Progress Notes (Signed)
Patient ID: Glenda HouseElizabeth A Cardenas, female   DOB: 12-22-1970, 48 y.o.   MRN: [id number]  Today's office visit was provided via telemedicine using video technique The patient was explained the limitations of evaluation and management by telemedicine and the availability of in person appointments.  The patient understood the limitations and agreed to proceed. Patient also understood that the telehealth visit is billable. . Location of the patient: Patient's home . Location of the provider: Physician office Only the patient and myself were participating in the encounter     Reason for Appointment:  Hypothyroidism, followup visit   History of Present Illness:   Her hypothyroidism presented following RAI treatment for Graves' disease on 09/28/11. She has been on various doses of thyroid supplements, ranging from 112 up to 150 mcg instead  When she was first diagnosed with hypothyroidism she had the following complaints: exhaustion, cramps, coldness, sleepiness, difficulty concentrating. Also had tingling in her hands, cold intolerance and difficulty tasting food.  She has been on relatively stable dose in the last 2 years also Because of various personal issues she did not refill her medication in 07/2014 and became significantly hypothyroid when seen in 12/15 Labs: highest TSH 51.9 previously.  Since then her thyroid level had been normal with the 137 g  RECENT history:  She has been getting levothyroxine for some time, this is supplied by [facility] On her previous visit she had been told to take her levothyroxine consistently with only water in the morning  She thinks she has been regular with this She will eat about 20 minutes after her levothyroxine In the last 5 days she has taken some glucosamine/chondroitin which has minimal calcium  She does not think she is starting to have any unusual fatigue She is exercising and keeping her weight about the same  In 2/20 since her TSH  was down to 0.2 she was told to take 6-1/2 tablets a week of the levothyroxine 125 mcg and she is doing this However TSH is now up to 5.3 and free T4 is slightly lower than before    Wt Readings from Last 3 Encounters:  10/09/18 170 lb 6.4 oz (77.3 kg)  04/04/18 169 lb 6.4 oz (76.8 kg)  04/04/17 158 lb 3.2 oz (71.8 kg)      Lab Results  Component Value Date   TSH 5.33 (H) 04/07/2019   TSH 0.20 (L) 10/07/2018   TSH 1.22 04/01/2018   FREET4 1.05 04/07/2019   FREET4 1.29 10/07/2018   FREET4 0.99 04/01/2018    Allergies as of 04/10/2019   No Known Allergies     Medication List       Accurate as of April 10, 2019 10:03 AM. If you have any questions, ask your nurse or doctor.        levothyroxine 125 MCG tablet Commonly known as: SYNTHROID 1 tablet daily before breakfast except half tablet on Sunday mornings            Past Medical History:  Diagnosis Date  . Hypertension   . Thyroid disease     Past Surgical History:  Procedure Laterality Date  . radioactive iodine    . TOOTH EXTRACTION  09/27/2012   Procedure: EXTRACTION MOLARS;  Surgeon: Glenda Cardenas, DDS;  [facility];  Service: Oral Surgery;  Laterality: Right;  INTRAORAL, EXTRAORAL,,  IRRIGATION AND DEBRIDEMENT SURGICAL REMOVAL # 30     Family History  Problem Relation Age of Onset  . CAD Father   .  Hypertension Father   . Hypertension Mother   . Breast cancer Mother 57  . Breast cancer Maternal Grandmother   . Thyroid disease Neg Hx     Social History:  reports that she has never smoked. She has never used smokeless tobacco. She reports current alcohol use. She reports that she does not use drugs.  Allergies: No Known Allergies  Review of Systems:  She has a  history of high blood pressure, followed by PCP. Previously taking Bystolic and subsequently lisinopril, currently not on any medication   She has had symptoms of menopause since 2019 , recently better and not on any HRT        Examination:  There were no vitals taken for this visit.  No exam done, patient is remote               Assessment:  Post ablative hypothyroidism with some variability in her dosage requirement, has been on 137 mcg previously and now 125  She has felt fairly good without any unusual fatigue despite her TSH of 5.3 She is taking her supplement very regularly now before breakfast Has been on generic for some time and [facility]   Plan:   She will continue taking 125 mcg prescription but make sure she takes this with water and no other supplements at breakfast or food or coffee However if her labs are still abnormal in 6 weeks may consider Unithroid brand She will need to take her glucosamine/chondroitin supplement at lunch or dinner  Glenda Cardenas 04/10/2019, 10:03 AM

## 2019-04-14 ENCOUNTER — Telehealth: Payer: Self-pay | Admitting: Endocrinology

## 2019-04-14 NOTE — Telephone Encounter (Signed)
-----   Message from Elayne Snare, MD sent at 04/10/2019 11:36 AM EDT ----- Regarding: Follow-up She will need to be having labs only in 6 weeks, follow-up with labs in 4 months also, thanks

## 2019-04-14 NOTE — Telephone Encounter (Signed)
Completed.

## 2019-05-19 ENCOUNTER — Other Ambulatory Visit: Payer: Self-pay

## 2019-05-19 ENCOUNTER — Other Ambulatory Visit (INDEPENDENT_AMBULATORY_CARE_PROVIDER_SITE_OTHER): Payer: BC Managed Care – PPO

## 2019-05-19 DIAGNOSIS — E890 Postprocedural hypothyroidism: Secondary | ICD-10-CM

## 2019-05-19 LAB — TSH: TSH: 3.95 u[IU]/mL (ref 0.35–4.50)

## 2019-05-28 ENCOUNTER — Other Ambulatory Visit: Payer: Self-pay

## 2019-05-28 ENCOUNTER — Encounter: Payer: Self-pay | Admitting: Endocrinology

## 2019-05-28 ENCOUNTER — Ambulatory Visit (INDEPENDENT_AMBULATORY_CARE_PROVIDER_SITE_OTHER): Payer: BC Managed Care – PPO | Admitting: Endocrinology

## 2019-05-28 DIAGNOSIS — E890 Postprocedural hypothyroidism: Secondary | ICD-10-CM | POA: Diagnosis not present

## 2019-05-28 NOTE — Progress Notes (Signed)
Patient ID: Glenda Cardenas, female   DOB: 06-25-1971, 48 y.o.   MRN: [id number]  Today's office visit was provided via telemedicine using video technique The patient was explained the limitations of evaluation and management by telemedicine and the availability of in person appointments.  The patient understood the limitations and agreed to proceed. Patient also understood that the telehealth visit is billable. . Location of the patient: Patient's home . Location of the provider: Physician office Only the patient and myself were participating in the encounter     Reason for Appointment:  Hypothyroidism, followup visit   History of Present Illness:   Her hypothyroidism presented following RAI treatment for Graves' disease on 09/28/11. She has been on various doses of thyroid supplements, ranging from 112 up to 150 mcg instead  When she was first diagnosed with hypothyroidism she had the following complaints: exhaustion, cramps, coldness, sleepiness, difficulty concentrating. Also had tingling in her hands, cold intolerance and difficulty tasting food.  She has been on relatively stable dose in the last 2 years also Because of various personal issues she did not refill her medication in 07/2014 and became significantly hypothyroid when seen in 12/15 Labs: highest TSH 51.9 previously.  Since then her thyroid level had been normal with the 137 g  RECENT history:  She has been getting levothyroxine for some time, [facility]  She has had inconsistent results with thyroxine supplementation with variable TSH levels  In 2/20 since her TSH was down to 0.2 she was told to take 6-1/2 tablets a week of the levothyroxine 125 mcg However with the small change her TSH then went up to 5.3 However she did not have any new symptoms of fatigue but this  Apparently had been regular with her supplement prior to this She takes her levothyroxine with either water or coffee  She will eat about 20 minutes after her levothyroxine Just before her last visit she was starting to take glucosamine/chondroitin which has minimal calcium at the same time as levothyroxine  She was told to start taking her levothyroxine with water only and no other supplements Also was told to take 7 tablets a week She has not missed any doses recently However sometimes will take her levothyroxine with coffee  Weight is same She does not complain of fatigue now and overall feels fairly good No recent skin changes or hair loss  Now with taking levothyroxine 125 mcg, 1 tablet daily her TSH is somewhat better at 3.95    Wt Readings from Last 3 Encounters:  10/09/18 170 lb 6.4 oz (77.3 kg)  04/04/18 169 lb 6.4 oz (76.8 kg)  04/04/17 158 lb 3.2 oz (71.8 kg)      Lab Results  Component Value Date   TSH 3.95 05/19/2019   TSH 5.33 (H) 04/07/2019   TSH 0.20 (L) 10/07/2018   FREET4 1.05 04/07/2019   FREET4 1.29 10/07/2018   FREET4 0.99 04/01/2018    Allergies as of 05/28/2019   No Known Allergies     Medication List       Accurate as of May 28, 2019  3:51 PM. If you have any questions, ask your nurse or doctor.        levothyroxine 125 MCG tablet Commonly known as: SYNTHROID 1 tablet daily before breakfast except half tablet on Sunday mornings            Past Medical History:  Diagnosis Date  . Hypertension   . Thyroid  disease     Past Surgical History:  Procedure Laterality Date  . radioactive iodine    . TOOTH EXTRACTION  09/27/2012   Procedure: EXTRACTION MOLARS;  Surgeon: Francene Finders, DDS;  Location: [facility] OR;  Service: Oral Surgery;  Laterality: Right;  INTRAORAL, EXTRAORAL,,  IRRIGATION AND DEBRIDEMENT SURGICAL REMOVAL # 30     Family History  Problem Relation Age of Onset  . CAD Father   . Hypertension Father   . Hypertension Mother   . Breast cancer Mother 23  . Breast cancer Maternal Grandmother   . Thyroid disease Neg Hx      Social History:  reports that she has never smoked. She has never used smokeless tobacco. She reports current alcohol use. She reports that she does not use drugs.  Allergies: No Known Allergies  Review of Systems:  She has a  history of high blood pressure, followed by PCP. Not on treatment  She has had symptoms of menopause since 2019, has had infrequent menstrual cycles and not on any HRT       Examination:  There were no vitals taken for this visit.  No exam done, patient is remote               Assessment:  Post ablative hypothyroidism with some variability in her dosage requirement, has been on 137 mcg previously and now 125  With taking levothyroxine 1 tablet daily her TSH is improved even though with the same dose in the past TSH has been slightly low No change in her weight  She is taking her supplement very regularly before breakfast but sometimes takes it with coffee Has been on generic levothyroxine and [facility] as before   Plan:   She will continue taking 125 mcg prescription but make sure she takes this with water and no other supplements  nor coffee  She will follow-up in 6 months  [name] 05/28/2019, 3:51 PM

## 2019-08-25 ENCOUNTER — Other Ambulatory Visit: Payer: BC Managed Care – PPO

## 2019-08-25 ENCOUNTER — Other Ambulatory Visit: Payer: Self-pay

## 2019-08-27 ENCOUNTER — Ambulatory Visit: Payer: BC Managed Care – PPO | Admitting: Endocrinology

## 2019-09-18 ENCOUNTER — Other Ambulatory Visit: Payer: BC Managed Care – PPO

## 2019-09-23 ENCOUNTER — Ambulatory Visit: Payer: BC Managed Care – PPO | Admitting: Endocrinology

## 2019-09-23 ENCOUNTER — Other Ambulatory Visit: Payer: Self-pay

## 2019-10-09 ENCOUNTER — Other Ambulatory Visit: Payer: Self-pay | Admitting: Endocrinology

## 2019-10-10 ENCOUNTER — Other Ambulatory Visit (INDEPENDENT_AMBULATORY_CARE_PROVIDER_SITE_OTHER): Payer: BC Managed Care – PPO

## 2019-10-10 ENCOUNTER — Other Ambulatory Visit: Payer: Self-pay

## 2019-10-10 DIAGNOSIS — E890 Postprocedural hypothyroidism: Secondary | ICD-10-CM

## 2019-10-10 NOTE — Addendum Note (Signed)
Addended by: Adline Mango I on: 10/10/2019 03:50 PM   Modules accepted: Orders

## 2019-10-10 NOTE — Addendum Note (Signed)
Addended by: STONE-ELMORE, Travante Knee I on: 10/10/2019 03:50 PM   Modules accepted: Orders  

## 2019-10-11 LAB — T4, FREE: Free T4: 1.47 ng/dL (ref 0.82–1.77)

## 2019-10-11 LAB — TSH: TSH: 0.594 u[IU]/mL (ref 0.450–4.500)

## 2019-10-14 ENCOUNTER — Ambulatory Visit (INDEPENDENT_AMBULATORY_CARE_PROVIDER_SITE_OTHER): Payer: BC Managed Care – PPO | Admitting: Endocrinology

## 2019-10-14 ENCOUNTER — Encounter: Payer: Self-pay | Admitting: Endocrinology

## 2019-10-14 ENCOUNTER — Other Ambulatory Visit: Payer: Self-pay

## 2019-10-14 DIAGNOSIS — E890 Postprocedural hypothyroidism: Secondary | ICD-10-CM

## 2019-10-14 NOTE — Progress Notes (Signed)
Patient ID: Glenda Cardenas, female   DOB: 10-02-1970, 49 y.o.   MRN: [id number]  Today's office visit was provided via telemedicine using video technique The patient was explained the limitations of evaluation and management by telemedicine and the availability of in person appointments.  The patient understood the limitations and agreed to proceed. Patient also understood that the telehealth visit is billable. . Location of the patient: Patient's home . Location of the provider: Physician office Only the patient and myself were participating in the encounter     Reason for Appointment:  Hypothyroidism, followup visit   History of Present Illness:   Her hypothyroidism presented following RAI treatment for Graves' disease on 09/28/11. She has been on various doses of thyroid supplements, ranging from 112 up to 150 mcg instead  When she was first diagnosed with hypothyroidism she had the following complaints: exhaustion, cramps, coldness, sleepiness, difficulty concentrating. Also had tingling in her hands, cold intolerance and difficulty tasting food.  She has been on relatively stable dose in the last 2 years also Because of various personal issues she did not refill her medication in 07/2014 and became significantly hypothyroid when seen in 12/15 Labs: highest TSH 51.9 previously.  Since then her thyroid level had been normal with the 137 g  RECENT history:  She has been getting levothyroxine for some time, [facility]  She has had inconsistent results with thyroxine supplementation with variable TSH levels  In 2/20 since her TSH was down to 0.2 she was told to take 6-1/2 tablets a week of the levothyroxine 125 mcg However with the small change her TSH then went up to 5.3 However she did not have any new symptoms of fatigue but this  Subsequently with taking levothyroxine 125 mcg, 1 tablet daily her TSH was back to normal  Also she was told to start  taking her levothyroxine with water, not coffee and no other supplements at the same time She has been able to follow the above instructions in her morning routine and usually eating breakfast couple of hours later. Has not missed any doses  She feels fairly good and does not complain of feeling tired or lethargic No significant change in her weight  Labs show normal results with TSH 0.6  Wt Readings from Last 3 Encounters:  10/09/18 170 lb 6.4 oz (77.3 kg)  04/04/18 169 lb 6.4 oz (76.8 kg)  04/04/17 158 lb 3.2 oz (71.8 kg)      Lab Results  Component Value Date   TSH 0.594 10/10/2019   TSH 3.95 05/19/2019   TSH 5.33 (H) 04/07/2019   FREET4 1.47 10/10/2019   FREET4 1.05 04/07/2019   FREET4 1.29 10/07/2018    Allergies as of 10/14/2019   No Known Allergies     Medication List       Accurate as of October 14, 2019  4:15 PM. If you have any questions, ask your nurse or doctor.        levothyroxine 125 MCG tablet Commonly known as: SYNTHROID 1 TABLET DAILY BEFORE BREAKFAST EXCEPT HALF TABLET ON SUNDAY MORNINGS            Past Medical History:  Diagnosis Date  . Hypertension   . Thyroid disease     Past Surgical History:  Procedure Laterality Date  . radioactive iodine    . TOOTH EXTRACTION  09/27/2012   Procedure: EXTRACTION MOLARS;  Surgeon: Francene Finders, DDS;  Location: [facility] OR;  Service:  Oral Surgery;  Laterality: Right;  INTRAORAL, EXTRAORAL,,  IRRIGATION AND DEBRIDEMENT SURGICAL REMOVAL # 30     Family History  Problem Relation Age of Onset  . CAD Father   . Hypertension Father   . Hypertension Mother   . Breast cancer Mother 55  . Breast cancer Maternal Grandmother   . Thyroid disease Neg Hx     Social History:  reports that she has never smoked. She has never used smokeless tobacco. She reports current alcohol use. She reports that she does not use drugs.  Allergies: No Known Allergies  Review of Systems:  She has a  history of high  blood pressure with no recent need for treatment  She said that she is in menopause now, apparently not on HRT   Examination:  There were no vitals taken for this visit.  No exam done, patient is seen on virtual visit               Assessment:  Post ablative hypothyroidism with some variability in her dosage requirement for the last couple of years  With taking levothyroxine 125 mcg, 1 tablet daily her TSH is consistently in the normal range She is subjectively doing well She thinks her weight is about the same Usually with mild hypothyroidism she generally does not have any noticeable symptoms  She is taking her supplement very regularly before breakfast with water only as directed  Has been on generic levothyroxine and [facility] before   Plan:   She will continue taking 125 mcg tablet once daily May consider a brand-name supplement if need be if her TSH continues to fluctuate  She will follow-up in 6 months  [name] 10/14/2019, 4:15 PM

## 2019-11-01 ENCOUNTER — Ambulatory Visit: Payer: BC Managed Care – PPO

## 2019-11-01 ENCOUNTER — Ambulatory Visit: Payer: BC Managed Care – PPO | Attending: Internal Medicine

## 2019-11-01 DIAGNOSIS — Z23 Encounter for immunization: Secondary | ICD-10-CM | POA: Insufficient documentation

## 2019-11-01 NOTE — Progress Notes (Signed)
   Covid-19 Vaccination Clinic  Name:  Glenda Cardenas    MRN: [id number] DOB: 11/22/1970  11/01/2019  [name] was observed post Covid-19 immunization for 15 minutes without incidence. She was provided with Vaccine Information Sheet and instruction to access the V-Safe system.   [name] was instructed to call 911 with any severe reactions post vaccine: Marland Kitchen Difficulty breathing  . Swelling of your face and throat  . A fast heartbeat  . A bad rash all over your body  . Dizziness and weakness    Immunizations Administered    Name Date Dose VIS Date Route   Pfizer COVID-19 Vaccine 11/01/2019  3:44 PM 0.3 mL 08/15/2019 Intramuscular   Manufacturer: [employer], [employer]   Lot: [id number]   NDC: [address]-2

## 2019-11-22 ENCOUNTER — Ambulatory Visit: Payer: BC Managed Care – PPO | Attending: Internal Medicine

## 2019-11-22 DIAGNOSIS — Z23 Encounter for immunization: Secondary | ICD-10-CM

## 2019-11-22 NOTE — Progress Notes (Signed)
   Covid-19 Vaccination Clinic  Name:  Glenda Cardenas    MRN: [id number] DOB: 1970/09/22  11/22/2019  [name] was observed post Covid-19 immunization for 15 minutes without incident. She was provided with Vaccine Information Sheet and instruction to access the V-Safe system.   [name] was instructed to call 911 with any severe reactions post vaccine: Marland Kitchen Difficulty breathing  . Swelling of face and throat  . A fast heartbeat  . A bad rash all over body  . Dizziness and weakness   Immunizations Administered    Name Date Dose VIS Date Route   Pfizer COVID-19 Vaccine 11/22/2019  8:46 AM 0.3 mL 08/15/2019 Intramuscular   Manufacturer: [employer], [employer]   Lot: [id number]   NDC: [address]-8

## 2019-12-31 ENCOUNTER — Ambulatory Visit (INDEPENDENT_AMBULATORY_CARE_PROVIDER_SITE_OTHER): Payer: BC Managed Care – PPO

## 2019-12-31 ENCOUNTER — Ambulatory Visit: Payer: BC Managed Care – PPO | Admitting: Podiatry

## 2019-12-31 ENCOUNTER — Other Ambulatory Visit: Payer: Self-pay

## 2019-12-31 ENCOUNTER — Encounter: Payer: Self-pay | Admitting: Podiatry

## 2019-12-31 VITALS — Temp 98.0°F

## 2019-12-31 DIAGNOSIS — M216X9 Other acquired deformities of unspecified foot: Secondary | ICD-10-CM

## 2019-12-31 DIAGNOSIS — M21619 Bunion of unspecified foot: Secondary | ICD-10-CM

## 2019-12-31 DIAGNOSIS — M216X1 Other acquired deformities of right foot: Secondary | ICD-10-CM | POA: Diagnosis not present

## 2019-12-31 DIAGNOSIS — M216X2 Other acquired deformities of left foot: Secondary | ICD-10-CM

## 2019-12-31 NOTE — Progress Notes (Signed)
Subjective:   Patient ID: Glenda Cardenas, female   DOB: 49 y.o.   MRN: [id number]   HPI Patient presents with painful bunion right over left and painful chronic lesion subsecond metatarsal right over left.  Patient states is been very sore and making it hard to walk in the bunion is very bothersome with history of father having the same problem.  Patient does not smoke likes to be active   Review of Systems  All other systems reviewed and are negative.       Objective:  Physical Exam Vitals and nursing note reviewed.  Constitutional:      Appearance: She is well-developed.  Pulmonary:     Effort: Pulmonary effort is normal.  Musculoskeletal:        General: Normal range of motion.  Skin:    General: Skin is warm.  Neurological:     Mental Status: She is alert.     Neurovascular status intact muscle strength adequate range of motion within normal limits with patient found to have hyperostosis medial aspect first metatarsal head right with redness around the joint surface and pain and plantarflexed second metatarsal keratotic lesion subsecond that is very painful when pressed.  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  I educated her discussed the treatments and the procedures and the distal versus proximal osteotomy and she wants this done.  She is scheduled for surgery in June and will reappoint may go over everything in greater detail  X-ray indicates significant structural bunion deformity right over left with slight deviation of the hallux right over left

## 2019-12-31 NOTE — Patient Instructions (Addendum)
Bunion  A bunion is a bump on the base of the big toe that forms when the bones of the big toe joint move out of position. Bunions may be small at first, but they often get larger over time. They can make walking painful. What are the causes? A bunion may be caused by:  Wearing narrow or pointed shoes that force the big toe to press against the other toes.  Abnormal foot development that causes the foot to roll inward (pronate).  Changes in the foot that are caused by certain diseases, such as rheumatoid arthritis or polio.  A foot injury. What increases the risk? The following factors may make you more likely to develop this condition:  Wearing shoes that squeeze the toes together.  Having certain diseases, such as: ? Rheumatoid arthritis. ? Polio. ? Cerebral palsy.  Having family members who have bunions.  Being born with a foot deformity, such as flat feet or low arches.  Doing activities that put a lot of pressure on the feet, such as ballet dancing. What are the signs or symptoms? The main symptom of a bunion is a noticeable bump on the big toe. Other symptoms may include:  Pain.  Swelling around the big toe.  Redness and inflammation.  Thick or hardened skin on the big toe or between the toes.  Stiffness or loss of motion in the big toe.  Trouble with walking. How is this diagnosed? A bunion may be diagnosed based on your symptoms, medical history, and activities. You may have tests, such as:  X-rays. These allow your health care provider to check the position of the bones in your foot and look for damage to your joint. They also help your health care provider determine the severity of your bunion and the best way to treat it.  Joint aspiration. In this test, a sample of fluid is removed from the toe joint. This test may be done if you are in a lot of pain. It helps rule out diseases that cause painful swelling of the joints, such as arthritis. How is this  treated? Treatment depends on the severity of your symptoms. The goal of treatment is to relieve symptoms and prevent the bunion from getting worse. Your health care provider may recommend:  Wearing shoes that have a wide toe box.  Using bunion pads to cushion the affected area.  Taping your toes together to keep them in a normal position.  Placing a device inside your shoe (orthotics) to help reduce pressure on your toe joint.  Taking medicine to ease pain, inflammation, and swelling.  Applying heat or ice to the affected area.  Doing stretching exercises.  Surgery to remove scar tissue and move the toes back into their normal position. This treatment is rare. Follow these instructions at home: Managing pain, stiffness, and swelling   If directed, put ice on the painful area: ? Put ice in a plastic bag. ? Place a towel between your skin and the bag. ? Leave the ice on for 20 minutes, 2-3 times a day. Activity   If directed, apply heat to the affected area before you exercise. Use the heat source that your health care provider recommends, such as a moist heat pack or a heating pad. ? Place a towel between your skin and the heat source. ? Leave the heat on for 20-30 minutes. ? Remove the heat if your skin turns bright red. This is especially important if you are unable to feel pain,   heat, or cold. You may have a greater risk of getting burned.  Do exercises as told by your health care provider. General instructions  Support your toe joint with proper footwear, shoe padding, or taping as told by your health care provider.  Take over-the-counter and prescription medicines only as told by your health care provider.  Keep all follow-up visits as told by your health care provider. This is important. Contact a health care provider if your symptoms:  Get worse.  Do not improve in 2 weeks. Get help right away if you have:  Severe pain and trouble with walking. Summary  A  bunion is a bump on the base of the big toe that forms when the bones of the big toe joint move out of position.  Bunions can make walking painful.  Treatment depends on the severity of your symptoms.  Support your toe joint with proper footwear, shoe padding, or taping as told by your health care provider. This information is not intended to replace advice given to you by your health care provider. Make sure you discuss any questions you have with your health care provider. Document Revised: 02/25/2018 Document Reviewed: 01/01/2018 Elsevier Patient Education  2020 [employer]   Pre-Operative Instructions  Congratulations, you have decided to take an important step towards improving your quality of life.  You can be assured that the doctors and staff at [facility] will be with you every step of the way.  Here are some important things you should know:  5. Plan to be at the surgery center/hospital at least 1 (one) hour prior to your scheduled time, unless otherwise directed by the surgical center/hospital staff.  You must have a responsible adult accompany you, remain during the surgery and drive you home.  Make sure you have directions to the surgical center/hospital to ensure you arrive on time. 6. If you are having surgery at [facility] or [facility], you will need a copy of your medical history and physical form from your family physician within one month prior to the date of surgery. We will give you a form for your primary physician to complete.  7. We make every effort to accommodate the date you request for surgery.  However, there are times where surgery dates or times have to be moved.  We will contact you as soon as possible if a change in schedule is required.   8. No aspirin/ibuprofen for one week before surgery.  If you are on aspirin, any non-steroidal anti-inflammatory medications (Mobic, Aleve, Ibuprofen) should not be taken seven (7) days prior to your  surgery.  You make take Tylenol for pain prior to surgery.  9. Medications - If you are taking daily heart and blood pressure medications, seizure, reflux, allergy, asthma, anxiety, pain or diabetes medications, make sure you notify the surgery center/hospital before the day of surgery so they can tell you which medications you should take or avoid the day of surgery. 10. No food or drink after midnight the night before surgery unless directed otherwise by surgical center/hospital staff. 11. No alcoholic beverages 24-hours prior to surgery.  No smoking 24-hours prior or 24-hours after surgery. 12. Wear loose pants or shorts. They should be loose enough to fit over bandages, boots, and casts. 13. Don't wear slip-on shoes. Sneakers are preferred. 14. Bring your boot with you to the surgery center/hospital.  Also bring crutches or a walker if your physician has prescribed it for you.  If you   do not have this equipment, it will be provided for you after surgery. 15. If you have not been contacted by the surgery center/hospital by the day before your surgery, call to confirm the date and time of your surgery. 16. Leave-time from work may vary depending on the type of surgery you have.  Appropriate arrangements should be made prior to surgery with your employer. 17. Prescriptions will be provided immediately following surgery by your doctor.  Fill these as soon as possible after surgery and take the medication as directed. Pain medications will not be refilled on weekends and must be approved by the doctor. 18. Remove nail polish on the operative foot and avoid getting pedicures prior to surgery. 19. Wash the night before surgery.  The night before surgery wash the foot and leg well with water and the antibacterial soap provided. Be sure to pay special attention to beneath the toenails and in between the toes.  Wash for at least three (3) minutes. Rinse thoroughly with water and dry well with a towel.  Perform  this wash unless told not to do so by your physician.  Enclosed: 1 Ice pack (please put in freezer the night before surgery)   1 Hibiclens skin cleaner   Pre-op instructions  If you have any questions regarding the instructions, please do not hesitate to call our office.  [place]: [address], [place], [place] [address] -- [phone]  [place]: [address] [place], [place] [address] -- [phone]  [place]: [address], [place], [place] [address] -- [phone]   Website: [email or web address] 

## 2020-01-02 ENCOUNTER — Other Ambulatory Visit: Payer: Self-pay | Admitting: Podiatry

## 2020-01-02 DIAGNOSIS — M216X9 Other acquired deformities of unspecified foot: Secondary | ICD-10-CM

## 2020-01-21 ENCOUNTER — Encounter: Payer: Self-pay | Admitting: Podiatry

## 2020-01-21 ENCOUNTER — Other Ambulatory Visit: Payer: Self-pay

## 2020-01-21 ENCOUNTER — Ambulatory Visit: Payer: BC Managed Care – PPO | Admitting: Podiatry

## 2020-01-21 VITALS — Temp 97.6°F

## 2020-01-21 DIAGNOSIS — M216X1 Other acquired deformities of right foot: Secondary | ICD-10-CM | POA: Diagnosis not present

## 2020-01-21 DIAGNOSIS — M21619 Bunion of unspecified foot: Secondary | ICD-10-CM | POA: Diagnosis not present

## 2020-01-21 DIAGNOSIS — M216X9 Other acquired deformities of unspecified foot: Secondary | ICD-10-CM

## 2020-01-21 NOTE — Patient Instructions (Signed)
Pre-Operative Instructions  Congratulations, you have decided to take an important step towards improving your quality of life.  You can be assured that the doctors and staff at [facility] will be with you every step of the way.  Here are some important things you should know:  1. Plan to be at the surgery center/hospital at least 1 (one) hour prior to your scheduled time, unless otherwise directed by the surgical center/hospital staff.  You must have a responsible adult accompany you, remain during the surgery and drive you home.  Make sure you have directions to the surgical center/hospital to ensure you arrive on time. 2. If you are having surgery at [facility] or [facility], you will need a copy of your medical history and physical form from your family physician within one month prior to the date of surgery. We will give you a form for your primary physician to complete.  3. We make every effort to accommodate the date you request for surgery.  However, there are times where surgery dates or times have to be moved.  We will contact you as soon as possible if a change in schedule is required.   4. No aspirin/ibuprofen for one week before surgery.  If you are on aspirin, any non-steroidal anti-inflammatory medications (Mobic, Aleve, Ibuprofen) should not be taken seven (7) days prior to your surgery.  You make take Tylenol for pain prior to surgery.  5. Medications - If you are taking daily heart and blood pressure medications, seizure, reflux, allergy, asthma, anxiety, pain or diabetes medications, make sure you notify the surgery center/hospital before the day of surgery so they can tell you which medications you should take or avoid the day of surgery. 6. No food or drink after midnight the night before surgery unless directed otherwise by surgical center/hospital staff. 7. No alcoholic beverages 24-hours prior to surgery.  No smoking 24-hours prior or 24-hours after  surgery. 8. Wear loose pants or shorts. They should be loose enough to fit over bandages, boots, and casts. 9. Don't wear slip-on shoes. Sneakers are preferred. 10. Bring your boot with you to the surgery center/hospital.  Also bring crutches or a walker if your physician has prescribed it for you.  If you do not have this equipment, it will be provided for you after surgery. 11. If you have not been contacted by the surgery center/hospital by the day before your surgery, call to confirm the date and time of your surgery. 12. Leave-time from work may vary depending on the type of surgery you have.  Appropriate arrangements should be made prior to surgery with your employer. 13. Prescriptions will be provided immediately following surgery by your doctor.  Fill these as soon as possible after surgery and take the medication as directed. Pain medications will not be refilled on weekends and must be approved by the doctor. 14. Remove nail polish on the operative foot and avoid getting pedicures prior to surgery. 15. Wash the night before surgery.  The night before surgery wash the foot and leg well with water and the antibacterial soap provided. Be sure to pay special attention to beneath the toenails and in between the toes.  Wash for at least three (3) minutes. Rinse thoroughly with water and dry well with a towel.  Perform this wash unless told not to do so by your physician.  Enclosed: 1 Ice pack (please put in freezer the night before surgery)   1 Hibiclens skin cleaner     Pre-op instructions  If you have any questions regarding the instructions, please do not hesitate to call our office.  [place]: [address], [place], [place] [address] -- [phone]  [place]: [address] [place], [place] [address] -- [phone]  [place]: [address], , [place] [address] -- [phone]   Website: [email or web address] 

## 2020-01-22 NOTE — Progress Notes (Signed)
Subjective:   Patient ID: Glenda Cardenas, female   DOB: 49 y.o.   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 Explained that this procedure we may not get complete correction on x-ray but I do feel it will give her good clinical correction with less risk and I explained the differences between distal proximal osteotomies and the pros and cons and she is opted for distal osteotomy. I did allow her to go over consent form reviewing alternative treatments complications associated with surgery and she is aware of all this and after review signed consent form. Patient scheduled for outpatient surgery [facility] understanding total recovery will take 6 months to 1 year and I also explained the chances for reoccurrence of plantar lesion or transfer lesion. Patient had air fracture walker dispensed with all instructions today and she will get used to it prior to procedure and find a shoe that fits well on the other foot. Patient is encouraged to call with questions prior to procedure

## 2020-02-04 ENCOUNTER — Telehealth: Payer: Self-pay

## 2020-02-04 NOTE — Telephone Encounter (Signed)
DOS 03/02/2020  [place] BUNIONECTOMY RT - 28296 METATARSAL OSTEOTOMY 2ND RT - 28308  [employer] ST EFFECTIVE DATE - 09/05/2019  In-Network   Max Per Benefit Period Year-to-Date Remaining     CoInsurance 30%      Deductible $1500.00 $1500.00     Out-Of-Pocket $5900.00 $5526.26

## 2020-02-23 DIAGNOSIS — M659 Synovitis and tenosynovitis, unspecified: Secondary | ICD-10-CM | POA: Insufficient documentation

## 2020-03-01 MED ORDER — ONDANSETRON HCL 4 MG PO TABS: 4.0000 mg | ORAL_TABLET | Freq: Three times a day (TID) | ORAL | 0 refills | Status: DC | PRN

## 2020-03-01 MED ORDER — OXYCODONE-ACETAMINOPHEN 10-325 MG PO TABS: 1.0000 | ORAL_TABLET | ORAL | 0 refills | Status: AC | PRN

## 2020-03-01 NOTE — Addendum Note (Signed)
Addended by: Lenn Sink on: 03/01/2020 04:14 PM   Modules accepted: Orders

## 2020-03-02 ENCOUNTER — Encounter: Payer: Self-pay | Admitting: Podiatry

## 2020-03-02 DIAGNOSIS — M21541 Acquired clubfoot, right foot: Secondary | ICD-10-CM

## 2020-03-02 DIAGNOSIS — M2011 Hallux valgus (acquired), right foot: Secondary | ICD-10-CM

## 2020-03-10 ENCOUNTER — Ambulatory Visit (INDEPENDENT_AMBULATORY_CARE_PROVIDER_SITE_OTHER): Payer: BC Managed Care – PPO

## 2020-03-10 ENCOUNTER — Encounter: Payer: Self-pay | Admitting: Podiatry

## 2020-03-10 ENCOUNTER — Other Ambulatory Visit: Payer: Self-pay

## 2020-03-10 ENCOUNTER — Ambulatory Visit (INDEPENDENT_AMBULATORY_CARE_PROVIDER_SITE_OTHER): Payer: BC Managed Care – PPO | Admitting: Podiatry

## 2020-03-10 VITALS — BP 161/92 | HR 76 | Temp 98.0°F | Resp 16

## 2020-03-10 DIAGNOSIS — M2011 Hallux valgus (acquired), right foot: Secondary | ICD-10-CM

## 2020-03-11 NOTE — Progress Notes (Signed)
Subjective:   Patient ID: Glenda Cardenas, female   DOB: 49 y.o.   MRN: [id number]   HPI Patient states doing very well with surgery and very pleased but wrapping feels tight   ROS      Objective:  Physical Exam  Neuro vascular status intact negative Denna Haggard' sign noted with patient's right foot healing well wound edges well coapted hallux in rectus position with good range of motion and good alignment noted     Assessment:  Doing well post forefoot surgery right     Plan:  H&P x-ray reviewed sterile dressing reapplied continue immobilization elevation compression surgical shoe dispensed with gradual increase in this and discussed continued motion for the first MPJ L educated them on how to do it  X-rays indicate the osteotomies are healing well with excellent reduction of preoperative ankle

## 2020-03-31 ENCOUNTER — Ambulatory Visit (INDEPENDENT_AMBULATORY_CARE_PROVIDER_SITE_OTHER): Payer: BC Managed Care – PPO | Admitting: Podiatry

## 2020-03-31 ENCOUNTER — Encounter: Payer: Self-pay | Admitting: Podiatry

## 2020-03-31 ENCOUNTER — Ambulatory Visit (INDEPENDENT_AMBULATORY_CARE_PROVIDER_SITE_OTHER): Payer: BC Managed Care – PPO

## 2020-03-31 ENCOUNTER — Other Ambulatory Visit: Payer: Self-pay

## 2020-03-31 DIAGNOSIS — M2011 Hallux valgus (acquired), right foot: Secondary | ICD-10-CM

## 2020-03-31 NOTE — Progress Notes (Signed)
Subjective:   Patient ID: Glenda Cardenas, female   DOB: 49 y.o.   MRN: [id number]   HPI under physical exam     ROS      Objective:  Physical Exam  Patient states overall doing really well with the right foot with good alignment and is very happy with motion and ready to get back into regular shoes. Neurovascular status found to be intact negative Denna Haggard' sign noted with excellent alignment first MPJ with good range of motion incisions healing well     Assessment:  Doing well post forefoot surgery right     Plan:  H&P x-rays reviewed and dispensed ankle compression stocking and encouraged gradual return to soft shoe over the next couple weeks. Reappoint 6 weeks or earlier if needed  X-rays indicate the osteotomy is healing well very slight stress on it but it is very normal and does not appear to have any movement with it at all

## 2020-04-17 ENCOUNTER — Other Ambulatory Visit: Payer: Self-pay | Admitting: Endocrinology

## 2020-05-12 ENCOUNTER — Ambulatory Visit (INDEPENDENT_AMBULATORY_CARE_PROVIDER_SITE_OTHER): Payer: BC Managed Care – PPO | Admitting: Podiatry

## 2020-05-12 ENCOUNTER — Ambulatory Visit (INDEPENDENT_AMBULATORY_CARE_PROVIDER_SITE_OTHER): Payer: BC Managed Care – PPO

## 2020-05-12 ENCOUNTER — Other Ambulatory Visit: Payer: Self-pay

## 2020-05-12 DIAGNOSIS — M2011 Hallux valgus (acquired), right foot: Secondary | ICD-10-CM | POA: Diagnosis not present

## 2020-05-13 NOTE — Progress Notes (Signed)
Subjective:   Patient ID: Glenda Cardenas, female   DOB: 49 y.o.   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 Good alignment was noted and good range of motion with no crepitus the joint with moderate discomfort around the first and second metatarsal phalangeal joint     Assessment:  Inflammatory condition with low-grade irritation of plantar tissue     Plan:  H&P reviewed condition. At this point I have recommended that she continue with soft type shoes and gradually increase her activity levels over the next 6 weeks and we will consider orthotics for the long-term  X-rays indicate osteotomies healing well good alignment noted no signs of pathology

## 2020-05-19 ENCOUNTER — Other Ambulatory Visit: Payer: Self-pay | Admitting: Endocrinology

## 2020-06-16 ENCOUNTER — Other Ambulatory Visit: Payer: Self-pay

## 2020-06-16 ENCOUNTER — Ambulatory Visit (INDEPENDENT_AMBULATORY_CARE_PROVIDER_SITE_OTHER): Payer: BC Managed Care – PPO | Admitting: Podiatry

## 2020-06-16 ENCOUNTER — Ambulatory Visit (INDEPENDENT_AMBULATORY_CARE_PROVIDER_SITE_OTHER): Payer: BC Managed Care – PPO

## 2020-06-16 ENCOUNTER — Encounter: Payer: Self-pay | Admitting: Podiatry

## 2020-06-16 DIAGNOSIS — M2011 Hallux valgus (acquired), right foot: Secondary | ICD-10-CM | POA: Diagnosis not present

## 2020-06-16 DIAGNOSIS — M779 Enthesopathy, unspecified: Secondary | ICD-10-CM

## 2020-06-18 NOTE — Progress Notes (Signed)
Subjective:   Patient ID: Glenda Cardenas, female   DOB: 49 y.o.   MRN: [id number]   HPI Patient states overall she is doing well but she does have some discomfort around the first metatarsal head right and states that it seems to be more related to just pressure when walking and that she needs more pads   ROS      Objective:  Physical Exam  Neurovascular status intact negative Denna Haggard' sign was noted with patient's right foot healing well with patient found to have good range of motion of the first MPJ and having good alignment of the second MPJ but does have plantar pressure around the lesser MPJs and is specifically the first MPJ     Assessment:  Overall doing well post osteotomy first right and second metatarsal     Plan:  H&P reviewed condition and at this point I have recommended a cushioned orthotic to disperse weight off the joint to be made of relative soft materials and to offload the first metatarsal and possibly the lesser metatarsals right over left foot.  Patient will see Raiford Noble for these to be made and again I am encouraging a softer type for tennis shoes and walking shoes.  Other than that at this time she is discharged from her surgery  X-rays indicate osteotomies healing well good alignment noted fixation in place

## 2020-06-23 ENCOUNTER — Other Ambulatory Visit: Payer: Self-pay | Admitting: Endocrinology

## 2020-06-29 ENCOUNTER — Other Ambulatory Visit: Payer: BC Managed Care – PPO | Admitting: Orthotics

## 2020-07-26 ENCOUNTER — Other Ambulatory Visit: Payer: Self-pay | Admitting: Endocrinology

## 2020-08-17 ENCOUNTER — Ambulatory Visit (INDEPENDENT_AMBULATORY_CARE_PROVIDER_SITE_OTHER): Payer: BC Managed Care – PPO | Admitting: Otolaryngology

## 2020-08-26 ENCOUNTER — Other Ambulatory Visit: Payer: Self-pay | Admitting: Endocrinology

## 2020-09-16 ENCOUNTER — Other Ambulatory Visit: Payer: Self-pay | Admitting: Endocrinology

## 2020-10-22 ENCOUNTER — Other Ambulatory Visit: Payer: Self-pay | Admitting: Endocrinology

## 2020-10-27 ENCOUNTER — Other Ambulatory Visit: Payer: Self-pay | Admitting: Endocrinology

## 2020-10-27 DIAGNOSIS — E890 Postprocedural hypothyroidism: Secondary | ICD-10-CM

## 2020-10-29 ENCOUNTER — Other Ambulatory Visit (INDEPENDENT_AMBULATORY_CARE_PROVIDER_SITE_OTHER): Payer: Self-pay

## 2020-10-29 ENCOUNTER — Other Ambulatory Visit: Payer: Self-pay

## 2020-10-29 DIAGNOSIS — E890 Postprocedural hypothyroidism: Secondary | ICD-10-CM

## 2020-10-29 LAB — T4, FREE: Free T4: 1.24 ng/dL (ref 0.60–1.60)

## 2020-10-29 LAB — TSH: TSH: 1.49 u[IU]/mL (ref 0.35–4.50)

## 2020-11-04 ENCOUNTER — Other Ambulatory Visit: Payer: Self-pay

## 2020-11-04 ENCOUNTER — Ambulatory Visit (INDEPENDENT_AMBULATORY_CARE_PROVIDER_SITE_OTHER): Payer: BC Managed Care – PPO | Admitting: Endocrinology

## 2020-11-04 ENCOUNTER — Encounter: Payer: Self-pay | Admitting: Endocrinology

## 2020-11-04 VITALS — BP 128/78 | HR 70 | Ht 65.0 in | Wt 167.0 lb

## 2020-11-04 DIAGNOSIS — E890 Postprocedural hypothyroidism: Secondary | ICD-10-CM

## 2020-11-04 NOTE — Progress Notes (Signed)
Patient ID: Glenda Cardenas, female   DOB: November 18, 1970, 50 y.o.   MRN: [id number]     Reason for Appointment:  Hypothyroidism, followup visit   History of Present Illness:   Her hypothyroidism presented following RAI treatment for Graves' disease on 09/28/11. She has been on various doses of thyroid supplements, ranging from 112 up to 150 mcg instead  When she was first diagnosed with hypothyroidism she had the following complaints: exhaustion, cramps, coldness, sleepiness, difficulty concentrating. Also had tingling in her hands, cold intolerance and difficulty tasting food.  She has been on relatively stable dose in the last 2 years also Because of various personal issues she did not refill her medication in 07/2014 and became significantly hypothyroid when seen in 12/15 Labs: highest TSH 51.9 previously.  Since then her thyroid level had been normal with the 137 g  RECENT history:  She has been getting levothyroxine for some time, this is supplied by [facility]  She has had inconsistent results with thyroxine supplementation with variable TSH levels  In 2/20 since her TSH was down to 0.2 she was told to take 6-1/2 tablets a week of the levothyroxine 125 mcg However with the small change her TSH then went up to 5.3 However she did not have any new symptoms of fatigue but this  Subsequently taking levothyroxine 125 mcg, 1 tablet daily  She is back here for her annual visit She feels fairly good with no unusual fatigue No cold intolerance or hair changes No significant change in her weight  She has been quite consistent with taking her levothyroxine with water in the morning up to 1 hour before breakfast  Labs show consistently normal results with TSH in the normal range again, no changes were made on her last visit  Wt Readings from Last 3 Encounters:  11/04/20 167 lb (75.8 kg)  10/09/18 170 lb 6.4 oz (77.3 kg)  04/04/18 169 lb 6.4 oz (76.8 kg)      Lab Results   Component Value Date   TSH 1.49 10/29/2020   TSH 0.594 10/10/2019   TSH 3.95 05/19/2019   FREET4 1.24 10/29/2020   FREET4 1.47 10/10/2019   FREET4 1.05 04/07/2019    Allergies as of 11/04/2020   No Known Allergies     Medication List       Accurate as of November 04, 2020  4:26 PM. If you have any questions, ask your nurse or doctor.        amoxicillin-clavulanate 875-125 MG tablet Commonly known as: AUGMENTIN SMARTSIG:1 Tablet(s) By Mouth Every 12 Hours   levothyroxine 125 MCG tablet Commonly known as: SYNTHROID 1 TABLET DAILY BEFORE BREAKFAST. PLEASE CALL FOR FOLLOW-UP APPOINTMENT   nystatin 100000 UNIT/ML suspension Commonly known as: MYCOSTATIN Take 5 mLs by mouth 4 (four) times daily.   ondansetron 4 MG tablet Commonly known as: Zofran Take 1 tablet (4 mg total) by mouth every 8 (eight) hours as needed for nausea or vomiting.   oxyCODONE-acetaminophen 10-325 MG tablet Commonly known as: Percocet Take 1 tablet by mouth every 4 (four) hours as needed for pain.            Past Medical History:  Diagnosis Date   Hypertension    Thyroid disease     Past Surgical History:  Procedure Laterality Date   radioactive iodine     TOOTH EXTRACTION  09/27/2012   Procedure: EXTRACTION MOLARS;  Surgeon: Francene Finders, DDS;  Location: [facility] OR;  Service: Oral Surgery;  Laterality: Right;  INTRAORAL, EXTRAORAL,,  IRRIGATION AND DEBRIDEMENT SURGICAL REMOVAL # 30     Family History  Problem Relation Age of Onset   CAD Father    Hypertension Father    Hypertension Mother    Breast cancer Mother 31   Breast cancer Maternal Grandmother    Thyroid disease Neg Hx     Social History:  reports that she has never smoked. She has never used smokeless tobacco. She reports current alcohol use. She reports that she does not use drugs.  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

## 2020-12-16 ENCOUNTER — Other Ambulatory Visit: Payer: Self-pay | Admitting: Family Medicine

## 2020-12-16 DIAGNOSIS — Z1231 Encounter for screening mammogram for malignant neoplasm of breast: Secondary | ICD-10-CM

## 2021-02-07 ENCOUNTER — Ambulatory Visit
Admission: RE | Admit: 2021-02-07 | Discharge: 2021-02-07 | Disposition: A | Discharge status: Home / Self Care | Payer: BC Managed Care – PPO | Source: Ambulatory Visit | Attending: Family Medicine | Admitting: Family Medicine

## 2021-02-07 ENCOUNTER — Other Ambulatory Visit: Payer: Self-pay

## 2021-02-07 DIAGNOSIS — Z1231 Encounter for screening mammogram for malignant neoplasm of breast: Secondary | ICD-10-CM

## 2021-02-20 ENCOUNTER — Other Ambulatory Visit: Payer: Self-pay | Admitting: Endocrinology

## 2022-02-18 ENCOUNTER — Other Ambulatory Visit: Payer: Self-pay | Admitting: Endocrinology

## 2022-02-21 ENCOUNTER — Ambulatory Visit
Admission: RE | Admit: 2022-02-21 | Discharge: 2022-02-21 | Disposition: A | Discharge status: Home / Self Care | Payer: BC Managed Care – PPO | Source: Ambulatory Visit | Attending: Family Medicine | Admitting: Family Medicine

## 2022-02-21 ENCOUNTER — Other Ambulatory Visit: Payer: Self-pay | Admitting: Family Medicine

## 2022-02-21 DIAGNOSIS — R053 Chronic cough: Secondary | ICD-10-CM

## 2022-03-20 ENCOUNTER — Other Ambulatory Visit: Payer: Self-pay | Admitting: Endocrinology

## 2022-03-28 ENCOUNTER — Other Ambulatory Visit: Payer: Self-pay | Admitting: Family Medicine

## 2022-03-28 DIAGNOSIS — Z1231 Encounter for screening mammogram for malignant neoplasm of breast: Secondary | ICD-10-CM

## 2022-04-11 ENCOUNTER — Ambulatory Visit
Admission: RE | Admit: 2022-04-11 | Discharge: 2022-04-11 | Disposition: A | Discharge status: Home / Self Care | Payer: BC Managed Care – PPO | Source: Ambulatory Visit | Attending: Family Medicine | Admitting: Family Medicine

## 2022-04-11 DIAGNOSIS — Z1231 Encounter for screening mammogram for malignant neoplasm of breast: Secondary | ICD-10-CM

## 2022-04-13 ENCOUNTER — Other Ambulatory Visit: Payer: Self-pay | Admitting: Family Medicine

## 2022-04-13 DIAGNOSIS — R928 Other abnormal and inconclusive findings on diagnostic imaging of breast: Secondary | ICD-10-CM

## 2022-04-18 ENCOUNTER — Other Ambulatory Visit: Payer: BC Managed Care – PPO

## 2022-05-04 ENCOUNTER — Other Ambulatory Visit: Payer: Self-pay | Admitting: Endocrinology

## 2022-05-04 DIAGNOSIS — E890 Postprocedural hypothyroidism: Secondary | ICD-10-CM

## 2022-05-09 ENCOUNTER — Other Ambulatory Visit (INDEPENDENT_AMBULATORY_CARE_PROVIDER_SITE_OTHER): Payer: BC Managed Care – PPO

## 2022-05-09 DIAGNOSIS — E890 Postprocedural hypothyroidism: Secondary | ICD-10-CM

## 2022-05-09 LAB — TSH: TSH: 10.07 u[IU]/mL — ABNORMAL HIGH (ref 0.35–5.50)

## 2022-05-09 LAB — T4, FREE: Free T4: 0.9 ng/dL (ref 0.60–1.60)

## 2022-05-11 ENCOUNTER — Ambulatory Visit: Payer: BC Managed Care – PPO | Admitting: Endocrinology

## 2022-05-11 VITALS — BP 150/96 | HR 72 | Ht 64.0 in | Wt 184.0 lb

## 2022-05-11 DIAGNOSIS — E890 Postprocedural hypothyroidism: Secondary | ICD-10-CM | POA: Diagnosis not present

## 2022-05-11 MED ORDER — LEVOTHYROXINE SODIUM 150 MCG PO TABS: 150.0000 ug | ORAL_TABLET | Freq: Every day | ORAL | 3 refills | Status: DC

## 2022-05-11 NOTE — Progress Notes (Signed)
Patient ID: Glenda Cardenas, female   DOB: [birth year], 51 y.o.   MRN: [id number]     Reason for Appointment:  Hypothyroidism, followup visit   History of Present Illness:   Her hypothyroidism presented following RAI treatment for Graves' disease on 09/28/11. She has been on various doses of thyroid supplements, ranging from 112 up to 150 mcg instead  When she was first diagnosed with hypothyroidism she had the following complaints: exhaustion, cramps, coldness, sleepiness, difficulty concentrating. Also had tingling in her hands, cold intolerance and difficulty tasting food.  She has been on relatively stable dose in the last 2 years also Because of various personal issues she did not refill her medication in 07/2014 and became significantly hypothyroid when seen in 12/15 Labs: highest TSH 51.9 previously.  Since then her thyroid level had been normal with the 137 g  RECENT history:  She has been getting levothyroxine for some time, this is supplied by [facility]  She has had inconsistent results with thyroxine supplementation with variable TSH levels  She is now taking levothyroxine 125 mcg, 1 tablet daily since 2/22  No changes were made on her last visit but she has not come back for follow-up until now At least for the last month or so she says she is feeling much more fatigued and lethargic This is despite being on anxiety medications and sleeping fairly well Has mild cold intolerance also Her weight has gone up over the last few months  She has been quite consistent with taking her levothyroxine with water in the morning up to 1 hour before breakfast  Labs show unusually high TSH of 10.1  Wt Readings from Last 3 Encounters:  05/11/22 184 lb (83.5 kg)  11/04/20 167 lb (75.8 kg)  10/09/18 170 lb 6.4 oz (77.3 kg)      Lab Results  Component Value Date   TSH 10.07 (H) 05/09/2022   TSH 1.49 10/29/2020   TSH 0.594 10/10/2019   FREET4 0.90 05/09/2022   FREET4  1.24 10/29/2020   FREET4 1.47 10/10/2019    Allergies as of 05/11/2022   No Known Allergies      Medication List        Accurate as of May 11, 2022  3:34 PM. If you have any questions, ask your nurse or doctor.          amoxicillin-clavulanate 875-125 MG tablet Commonly known as: AUGMENTIN SMARTSIG:1 Tablet(s) By Mouth Every 12 Hours   escitalopram 20 MG tablet Commonly known as: LEXAPRO Take 20 mg by mouth daily.   levothyroxine 125 MCG tablet Commonly known as: SYNTHROID 1 TABLET DAILY BEFORE BREAKFAST. PLEASE CALL FOR FOLLOW-UP APPOINTMENT   nystatin 100000 UNIT/ML suspension Commonly known as: MYCOSTATIN Take 5 mLs by mouth 4 (four) times daily.   ondansetron 4 MG tablet Commonly known as: Zofran Take 1 tablet (4 mg total) by mouth every 8 (eight) hours as needed for nausea or vomiting.   oxyCODONE-acetaminophen 10-325 MG tablet Commonly known as: Percocet Take 1 tablet by mouth every 4 (four) hours as needed for pain.   telmisartan-hydrochlorothiazide 40-12.5 MG tablet Commonly known as: MICARDIS HCT Take 1 tablet by mouth daily.             Past Medical History:  Diagnosis Date   Hypertension    Thyroid disease     Past Surgical History:  Procedure Laterality Date   radioactive iodine     TOOTH EXTRACTION  09/27/2012   Procedure: EXTRACTION MOLARS;  Surgeon: Francene Finders, DDS;  Location: [facility] OR;  Service: Oral Surgery;  Laterality: Right;  INTRAORAL, EXTRAORAL,,  IRRIGATION AND DEBRIDEMENT SURGICAL REMOVAL # 30     Family History  Problem Relation Age of Onset   CAD Father    Hypertension Father    Hypertension Mother    Breast cancer Mother 62   Breast cancer Maternal Grandmother    Thyroid disease Neg Hx     Social History:  reports that she has never smoked. She has never used smokeless tobacco. She reports current alcohol use. She reports that she does not use drugs.  Allergies: No Known Allergies  Review of  Systems:  She has a  history of high blood pressure, home Bp 130/79 recently and she is back on treatment  BP Readings from Last 3 Encounters:  05/11/22 (!) 150/96  11/04/20 128/78  03/10/20 (!) 161/92    She is also on generic Lexapro now   Examination:  BP (!) 150/96   Pulse 72   Ht 5\' 4"  (1.626 m)   Wt 184 lb (83.5 kg)   LMP 09/04/2018   SpO2 99%   BMI 31.58 kg/m      Thyroid not palpable Biceps reflexes show normal relaxation       Assessment:  Post ablative hypothyroidism with variability in her dosage requirement  in the last few years  She has  not been seen for almost a year and a half Even though she has been quite regular with her supplement and taking it as directed in the mornings before eating she has significant hypothyroidism Also has had significant weight gain since her last visit Some of her fatigue may be related to hypothyroidism with TSH about 10   Plan:   She will go up to 150 mcg levothyroxine She does need to be seen more regularly for follow-up However will need to have her come back in 2-3 months for follow-up  If her fatigue is not improved she will need to follow-up with her PCP  Continue follow-up with PCP regarding hypertension  11/03/2018 05/11/2022, 3:34 PM

## 2022-06-01 ENCOUNTER — Ambulatory Visit
Admission: RE | Admit: 2022-06-01 | Discharge: 2022-06-01 | Disposition: A | Discharge status: Home / Self Care | Payer: BC Managed Care – PPO | Source: Ambulatory Visit | Attending: Family Medicine | Admitting: Family Medicine

## 2022-06-01 ENCOUNTER — Other Ambulatory Visit: Payer: Self-pay | Admitting: Family Medicine

## 2022-06-01 DIAGNOSIS — R928 Other abnormal and inconclusive findings on diagnostic imaging of breast: Secondary | ICD-10-CM

## 2022-06-01 DIAGNOSIS — N6320 Unspecified lump in the left breast, unspecified quadrant: Secondary | ICD-10-CM

## 2022-08-09 ENCOUNTER — Other Ambulatory Visit (INDEPENDENT_AMBULATORY_CARE_PROVIDER_SITE_OTHER): Payer: BC Managed Care – PPO

## 2022-08-09 DIAGNOSIS — E890 Postprocedural hypothyroidism: Secondary | ICD-10-CM | POA: Diagnosis not present

## 2022-08-09 LAB — T4, FREE: Free T4: 1.21 ng/dL (ref 0.60–1.60)

## 2022-08-09 LAB — TSH: TSH: 0.46 u[IU]/mL (ref 0.35–5.50)

## 2022-08-16 ENCOUNTER — Telehealth (INDEPENDENT_AMBULATORY_CARE_PROVIDER_SITE_OTHER): Payer: BC Managed Care – PPO | Admitting: Endocrinology

## 2022-08-16 VITALS — Ht 63.0 in | Wt 178.0 lb

## 2022-08-16 DIAGNOSIS — E890 Postprocedural hypothyroidism: Secondary | ICD-10-CM | POA: Diagnosis not present

## 2022-08-16 NOTE — Progress Notes (Signed)
Patient ID: Glenda Cardenas, female   DOB: [birth year], 51 y.o.   MRN: [id number]  I connected with the above-named patient by video enabled telemedicine application and verified that I am speaking with the correct person. The patient was explained the limitations of evaluation and management by telemedicine and the availability of in person appointments.  Patient also understood that there may be a patient responsible charge related to this service  Location of the patient: Patient's home  Location of the provider: Physician office Only the patient and myself were participating in the encounter The patient understood the above statements and agreed to proceed.    Reason for Appointment:  Hypothyroidism, followup visit   History of Present Illness:   Her hypothyroidism presented following RAI treatment for Graves' disease on 09/28/11. She has been on various doses of thyroid supplements, ranging from 112 up to 150 mcg instead  When she was first diagnosed with hypothyroidism she had the following complaints: exhaustion, cramps, coldness, sleepiness, difficulty concentrating. Also had tingling in her hands, cold intolerance and difficulty tasting food.  She has been on relatively stable dose in the last 2 years also Because of various personal issues she did not refill her medication in 07/2014 and became significantly hypothyroid when seen in 12/15 Labs: highest TSH 51.9 previously.  Since then her thyroid level had been normal with the 137 g  RECENT history:  She has been getting generic levothyroxine for some time, this is [facility] as before  She has had periodic need to adjust her dosage  She is now taking levothyroxine 150 mcg daily since her last visit in 9/23 On that visit she was feeling much more fatigued and lethargic and her TSH was about 10  Subsequently with increasing the dose to 150 and her fatigue was improved She thinks she has lost at least 5  pounds  She has been quite regular again with taking her levothyroxine with water in the morning up to 1 hour before breakfast  Labs show TSH back to about 0.5  Wt Readings from Last 3 Encounters:  05/11/22 184 lb (83.5 kg)  11/04/20 167 lb (75.8 kg)  10/09/18 170 lb 6.4 oz (77.3 kg)      Lab Results  Component Value Date   TSH 0.46 08/09/2022   TSH 10.07 (H) 05/09/2022   TSH 1.49 10/29/2020   FREET4 1.21 08/09/2022   FREET4 0.90 05/09/2022   FREET4 1.24 10/29/2020    Allergies as of 08/16/2022   No Known Allergies      Medication List        Accurate as of August 16, 2022  8:21 AM. If you have any questions, ask your nurse or doctor.          amoxicillin-clavulanate 875-125 MG tablet Commonly known as: AUGMENTIN SMARTSIG:1 Tablet(s) By Mouth Every 12 Hours   escitalopram 20 MG tablet Commonly known as: LEXAPRO Take 20 mg by mouth daily.   levothyroxine 150 MCG tablet Commonly known as: SYNTHROID Take 1 tablet (150 mcg total) by mouth daily.   nystatin 100000 UNIT/ML suspension Commonly known as: MYCOSTATIN Take 5 mLs by mouth 4 (four) times daily.   ondansetron 4 MG tablet Commonly known as: Zofran Take 1 tablet (4 mg total) by mouth every 8 (eight) hours as needed for nausea or vomiting.   oxyCODONE-acetaminophen 10-325 MG tablet Commonly known as: Percocet Take 1 tablet by mouth every 4 (four) hours as needed for pain.   telmisartan-hydrochlorothiazide  40-12.5 MG tablet Commonly known as: MICARDIS HCT Take 1 tablet by mouth daily.             Past Medical History:  Diagnosis Date   Hypertension    Thyroid disease     Past Surgical History:  Procedure Laterality Date   radioactive iodine     TOOTH EXTRACTION  09/27/2012   Procedure: EXTRACTION MOLARS;  Surgeon: Francene Finders, DDS;  Location: [facility] OR;  Service: Oral Surgery;  Laterality: Right;  INTRAORAL, EXTRAORAL,,  IRRIGATION AND DEBRIDEMENT SURGICAL REMOVAL # 30      Family History  Problem Relation Age of Onset   CAD Father    Hypertension Father    Hypertension Mother    Breast cancer Mother 61   Breast cancer Maternal Grandmother    Thyroid disease Neg Hx     Social History:  reports that she has never smoked. She has never used smokeless tobacco. She reports current alcohol use. She reports that she does not use drugs.  Allergies: No Known Allergies  Review of Systems:  She has a  history of high blood pressure, monitored by PA CP, she is seeing a PA  BP Readings from Last 3 Encounters:  05/11/22 (!) 150/96  11/04/20 128/78  03/10/20 (!) 161/92    She is also on generic Lexapro    Examination:  LMP 09/04/2018    Assessment:  Post ablative hypothyroidism with variability in her dosage requirement  in the last few years  Possibly because of her weight gain this year she has required higher dose of levothyroxine With 150 mcg levothyroxine and she is subjectively doing better and her TSH is normal In the past even with low normal TSH levels she has tended to get high TSH even with small adjustments   Plan:   She will continue 150 mcg levothyroxine She will follow-up in 6 months  Okechukwu Regnier 08/16/2022, 8:21 AM

## 2022-09-07 IMAGING — MG MM DIGITAL SCREENING BILAT W/ TOMO AND CAD
8 series · 9 of 24 positions shown · non-contrast
Comparison: Previous exam(s).

CLINICAL DATA: Screening.

EXAM:
DIGITAL SCREENING BILATERAL MAMMOGRAM WITH TOMOSYNTHESIS AND CAD
TECHNIQUE: Bilateral screening digital craniocaudal and mediolateral oblique
mammograms were obtained. Bilateral screening digital breast
tomosynthesis was performed. The images were evaluated with
computer-aided detection.

[R MLO synth-2D]
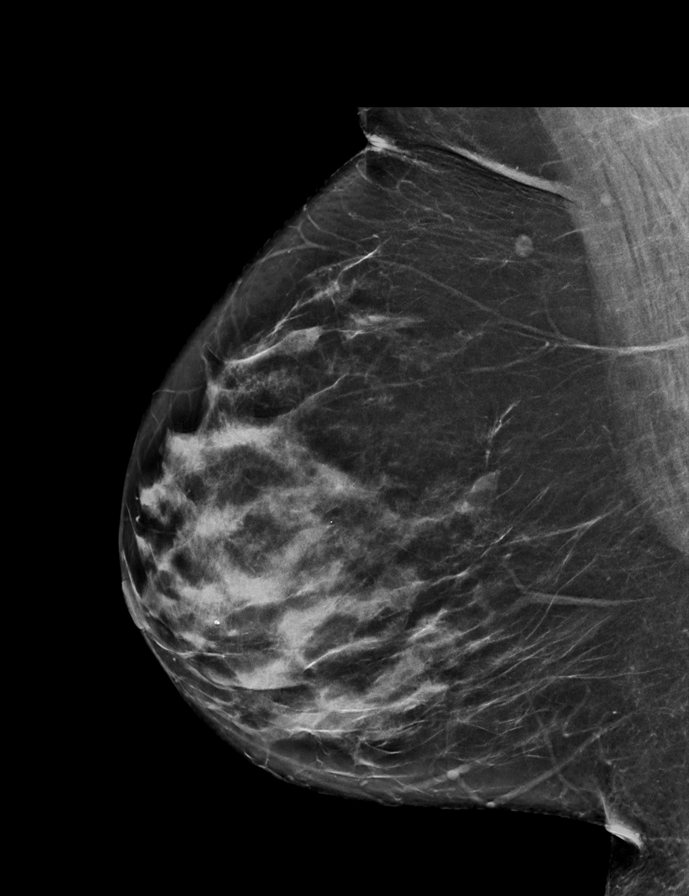

[L CC synth-2D]
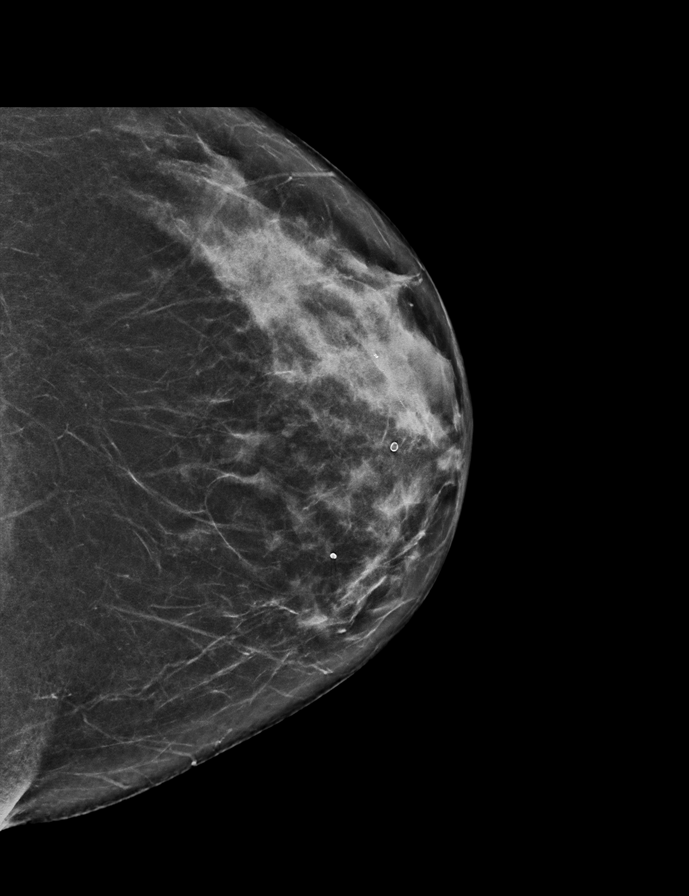

[L MLO synth-2D]
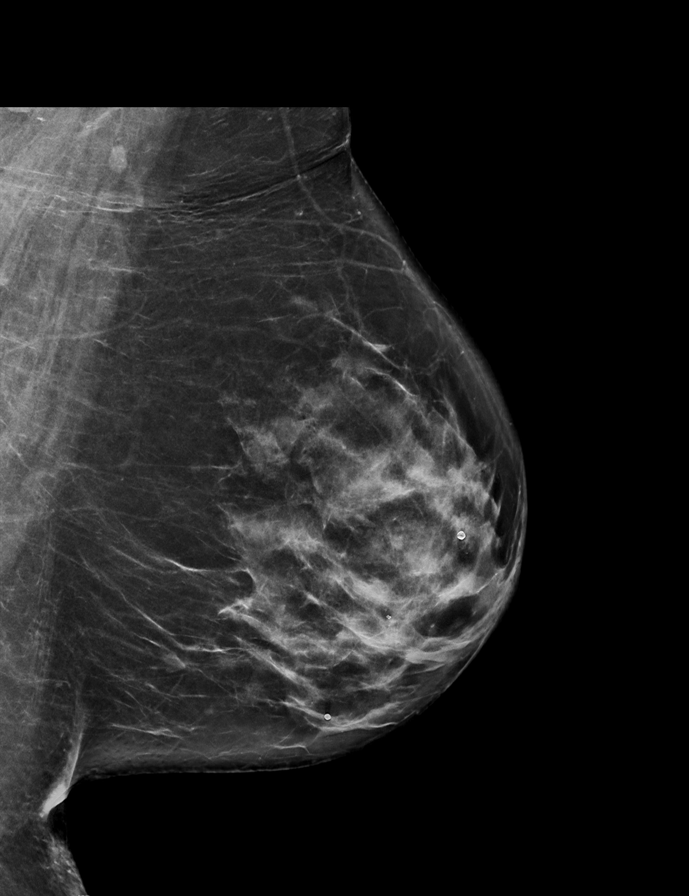

[R CC synth-2D]
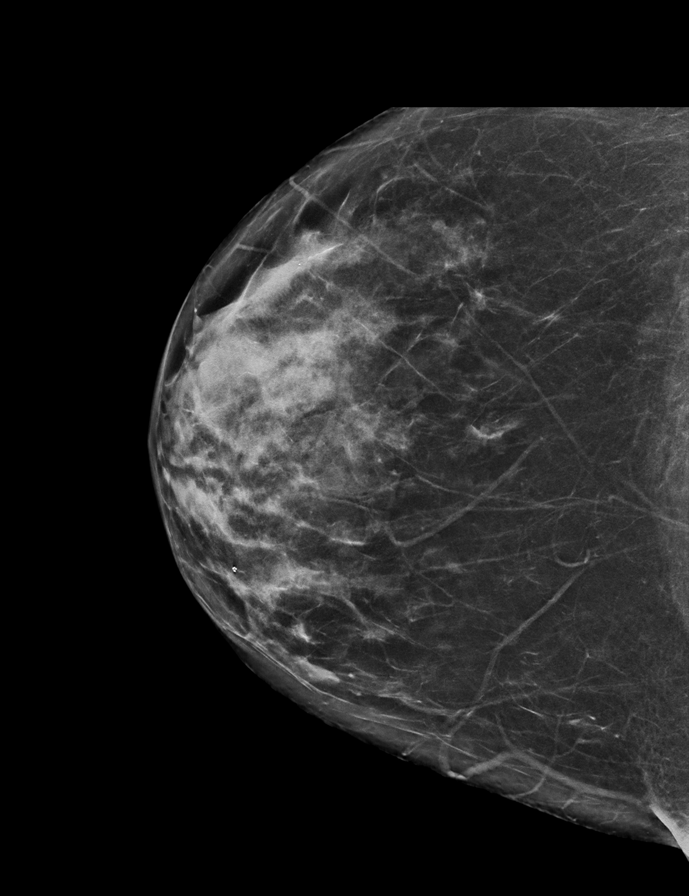

[L CC tomo · 2 of 62 frames shown]
[frame 21/62]
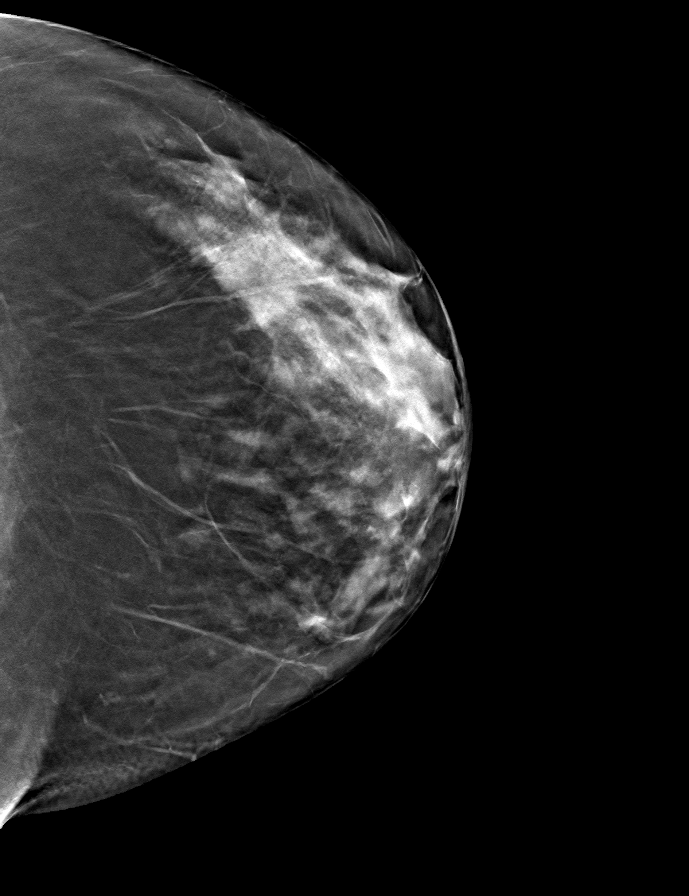
[frame 31/62]
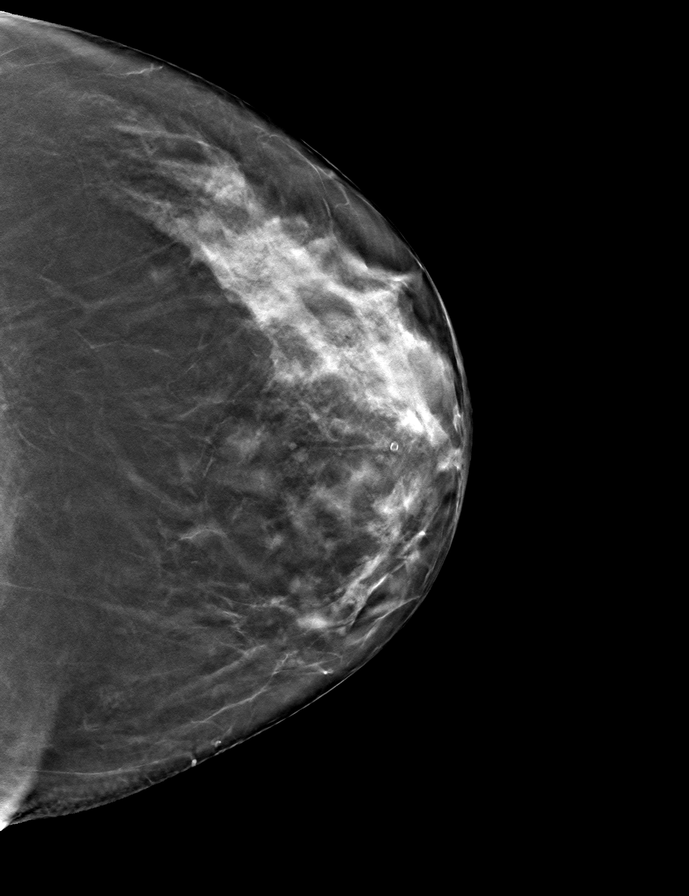

[R CC tomo · tomo slice 32/63.0]
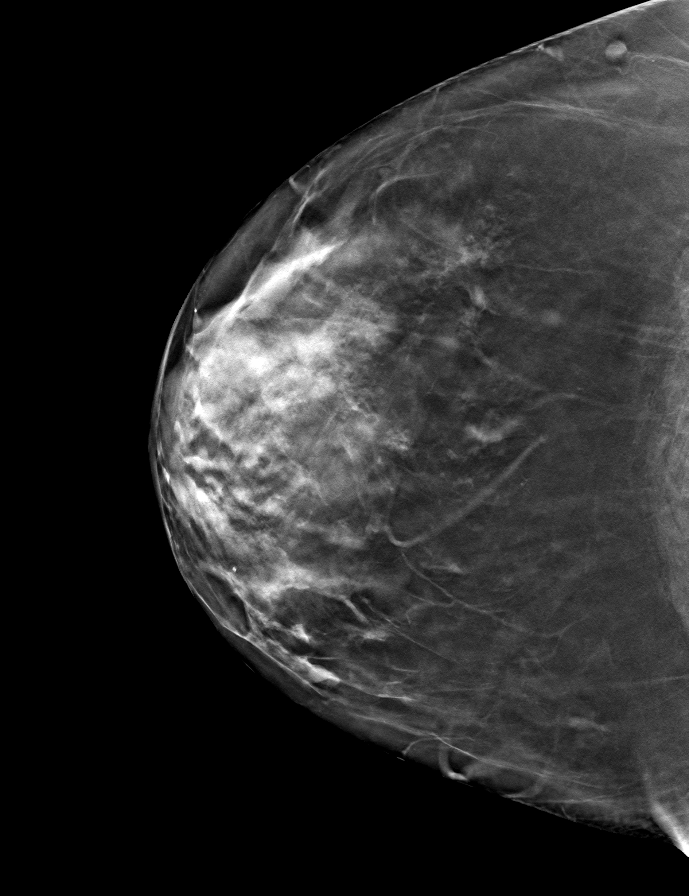

[R MLO tomo · tomo slice 35/68.0]
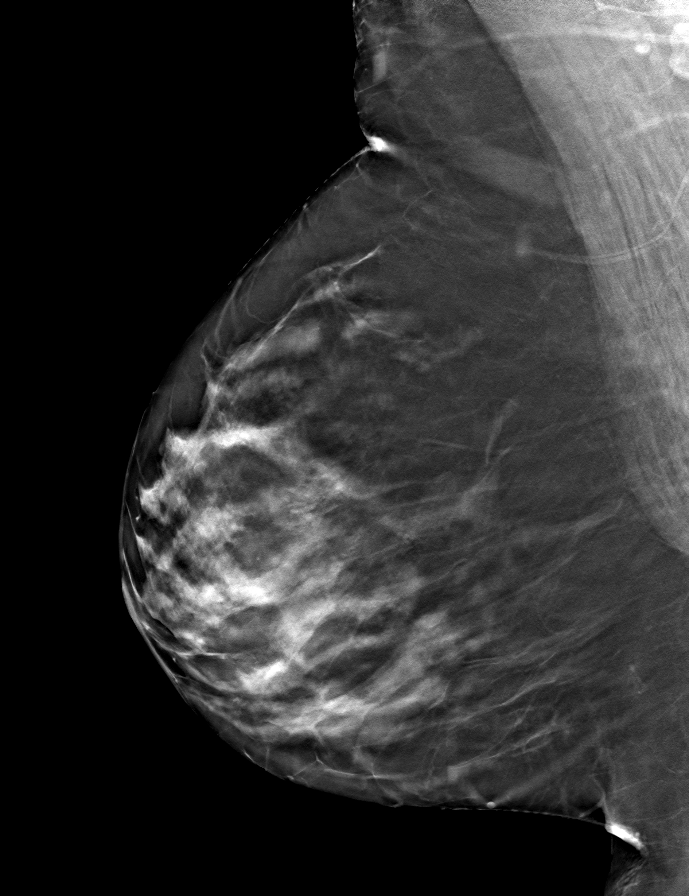

[L MLO tomo · tomo slice 39/76.0]
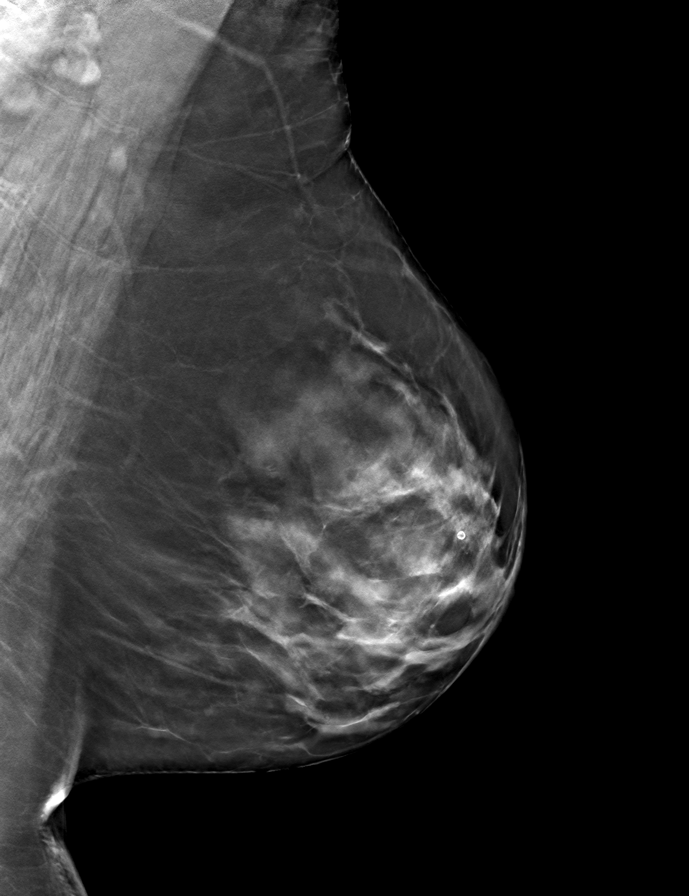

[9 of 24 positions shown; findings below may reference images not displayed]

ACR Breast Density Category c: The breast tissue is heterogeneously
dense, which may obscure small masses.
FINDINGS: There are no findings suspicious for malignancy. The images were
evaluated with computer-aided detection.
IMPRESSION: No mammographic evidence of malignancy. A result letter of this
screening mammogram will be mailed directly to the patient.

RECOMMENDATION:
Screening mammogram in one year. (Code:T4-5-GWO)

BI-RADS CATEGORY  1: Negative.

## 2022-11-30 ENCOUNTER — Ambulatory Visit
Admission: RE | Admit: 2022-11-30 | Discharge: 2022-11-30 | Disposition: A | Discharge status: Home / Self Care | Payer: BC Managed Care – PPO | Source: Ambulatory Visit | Attending: Family Medicine | Admitting: Family Medicine

## 2022-11-30 ENCOUNTER — Other Ambulatory Visit: Payer: Self-pay | Admitting: Family Medicine

## 2022-11-30 DIAGNOSIS — N6320 Unspecified lump in the left breast, unspecified quadrant: Secondary | ICD-10-CM

## 2022-11-30 DIAGNOSIS — N6489 Other specified disorders of breast: Secondary | ICD-10-CM

## 2023-01-04 ENCOUNTER — Other Ambulatory Visit: Payer: Self-pay | Admitting: Family Medicine

## 2023-01-04 ENCOUNTER — Ambulatory Visit
Admission: RE | Admit: 2023-01-04 | Discharge: 2023-01-04 | Disposition: A | Discharge status: Home / Self Care | Payer: BC Managed Care – PPO | Source: Ambulatory Visit | Attending: Family Medicine | Admitting: Family Medicine

## 2023-01-04 DIAGNOSIS — R10819 Abdominal tenderness, unspecified site: Secondary | ICD-10-CM

## 2023-01-04 MED ORDER — IOPAMIDOL (ISOVUE-300) INJECTION 61%
100.0000 mL | Freq: Once | INTRAVENOUS | Status: AC | PRN
  Administered 2023-01-04: 100 mL via INTRAVENOUS

## 2023-03-01 ENCOUNTER — Other Ambulatory Visit: Payer: Self-pay | Admitting: Endocrinology

## 2023-03-15 ENCOUNTER — Other Ambulatory Visit: Payer: Self-pay | Admitting: Endocrinology

## 2023-07-27 ENCOUNTER — Other Ambulatory Visit: Payer: Self-pay

## 2023-07-27 DIAGNOSIS — E890 Postprocedural hypothyroidism: Secondary | ICD-10-CM

## 2023-07-27 MED ORDER — LEVOTHYROXINE SODIUM 150 MCG PO TABS: 150.0000 ug | ORAL_TABLET | Freq: Every day | ORAL | 0 refills | Status: DC

## 2023-07-27 NOTE — Telephone Encounter (Signed)
Levothyroxine refill request complete, sent patient Mychart message to make her aware

## 2023-08-15 ENCOUNTER — Ambulatory Visit
Admission: RE | Admit: 2023-08-15 | Discharge: 2023-08-15 | Disposition: A | Discharge status: Home / Self Care | Payer: BC Managed Care – PPO | Source: Ambulatory Visit | Attending: Family Medicine | Admitting: Family Medicine

## 2023-08-15 DIAGNOSIS — N6489 Other specified disorders of breast: Secondary | ICD-10-CM

## 2023-08-15 DIAGNOSIS — N6320 Unspecified lump in the left breast, unspecified quadrant: Secondary | ICD-10-CM

## 2023-08-23 ENCOUNTER — Other Ambulatory Visit: Payer: Self-pay | Admitting: Endocrinology

## 2023-08-23 DIAGNOSIS — E890 Postprocedural hypothyroidism: Secondary | ICD-10-CM

## 2024-01-29 ENCOUNTER — Ambulatory Visit: Admitting: Podiatry

## 2024-02-17 ENCOUNTER — Other Ambulatory Visit: Payer: Self-pay | Admitting: Endocrinology

## 2024-02-17 DIAGNOSIS — E890 Postprocedural hypothyroidism: Secondary | ICD-10-CM

## 2024-02-18 NOTE — Telephone Encounter (Signed)
 Patient called regarding needing a refill on her Levothyroxine  . Was a former patient of Dr.Kumar,and is scheduled with Dr.Motwani for 04/02/24 and needs enough medication to last until then.

## 2024-03-05 ENCOUNTER — Other Ambulatory Visit: Payer: Self-pay

## 2024-03-05 DIAGNOSIS — E890 Postprocedural hypothyroidism: Secondary | ICD-10-CM

## 2024-03-05 MED ORDER — LEVOTHYROXINE SODIUM 150 MCG PO TABS: 150.0000 ug | ORAL_TABLET | Freq: Every day | ORAL | 0 refills | Status: DC

## 2024-03-05 NOTE — Telephone Encounter (Signed)
30 day refill sent in.

## 2024-04-02 ENCOUNTER — Ambulatory Visit: Payer: Self-pay | Admitting: "Endocrinology

## 2024-07-08 ENCOUNTER — Other Ambulatory Visit

## 2024-07-08 ENCOUNTER — Ambulatory Visit: Payer: Self-pay | Admitting: "Endocrinology

## 2024-07-08 ENCOUNTER — Encounter: Payer: Self-pay | Admitting: "Endocrinology

## 2024-07-08 VITALS — BP 96/67 | HR 67 | Ht 65.0 in | Wt 176.0 lb

## 2024-07-08 DIAGNOSIS — E890 Postprocedural hypothyroidism: Secondary | ICD-10-CM | POA: Diagnosis not present

## 2024-07-08 NOTE — Progress Notes (Signed)
 Outpatient Endocrinology Note Obadiah Birmingham, MD  07/08/24   Glenda Cardenas 04-16-1971 [id number]  Referring Provider: Gib Charleston, MD Primary Care Provider: Gib Charleston, MD Subjective  Chief Complaint  Patient presents with   Thyroid  Problem    No concerns    Assessment & Plan  Keniya was seen today for thyroid  problem.  Diagnoses and all orders for this visit:  Hypothyroidism following radioiodine therapy -     TSH + free T4   Glenda Cardenas is currently taking levothyroxine  150 mcg po qd. Patient is currently biochemically euthyroid.  Educated on thyroid  axis.  Recommend the following: Take levothyroxine  150mcg every morning.  Advised to take levothyroxine  first thing in the morning on empty stomach and wait at least 30 minutes to 1 hour before eating or drinking anything or taking any other medications. Space out levothyroxine  by 4 hours from any acid reflux medication/fibrate/iron/calcium/multivitamin. Advised to take birth control pills and nutritional supplements in the evening. Repeat lab before next visit or sooner if symptoms of hyperthyroidism or hypothyroidism develop.  Notify us  immediately in case of pregnancy/breastfeeding or significant weight gain or loss. Counseled on compliance and follow up needs.  I have reviewed current medications, nurse's notes, allergies, vital signs, past medical and surgical history, family medical history, and social history for this encounter. Counseled patient on symptoms, examination findings, lab findings, imaging results, treatment decisions and monitoring and prognosis. The patient understood the recommendations and agrees with the treatment plan. All questions regarding treatment plan were fully answered.   Return in about 15 days (around 07/23/2024) for tele-visit, labs today.   Obadiah Birmingham, MD  07/08/24   I have reviewed current medications, nurse's notes, allergies, vital signs, past medical  and surgical history, family medical history, and social history for this encounter. Counseled patient on symptoms, examination findings, lab findings, imaging results, treatment decisions and monitoring and prognosis. The patient understood the recommendations and agrees with the treatment plan. All questions regarding treatment plan were fully answered.   History of Present Illness Glenda Cardenas is a 53 y.o. year old female who presents to our clinic with postablative hypothyroidism since 2023.    Her hypothyroidism presented following RAI treatment for Graves' disease on 09/28/11. She used to see Dr. Von in the past.  Symptoms suggestive of HYPOTHYROIDISM:  fatigue Yes weight gain No cold intolerance  No constipation  No  Symptoms suggestive of HYPERTHYROIDISM:  weight loss  No heat intolerance No hyperdefecation  No palpitations  No  Compressive symptoms:  dysphagia  No dysphonia  No positional dyspnea (especially with simultaneous arms elevation)  No  Smokes  No On biotin  No Personal history of head/neck surgery/irradiation  No  Physical Exam  BP 96/67   Pulse 67   Ht 5' 5 (1.651 m)   Wt 176 lb (79.8 kg)   LMP 09/04/2018   SpO2 97%   BMI 29.29 kg/m  Constitutional: well developed, well nourished Head: normocephalic, atraumatic, no exophthalmos Eyes: sclera anicteric, no redness Neck: no thyromegaly, no thyroid  tenderness; no nodules palpated Lungs: normal respiratory effort Neurology: alert and oriented, no fine hand tremor Skin: dry, no appreciable rashes Musculoskeletal: no appreciable defects Psychiatric: normal mood and affect  Allergies No Known Allergies  Current Medications Patient's Medications  New Prescriptions   No medications on file  Previous Medications   ESCITALOPRAM (LEXAPRO) 20 MG TABLET    Take 20 mg by mouth daily.   LEVOTHYROXINE  (SYNTHROID ) 150 MCG TABLET  Take 1 tablet (150 mcg total) by mouth daily.    OXYCODONE -ACETAMINOPHEN  (PERCOCET) 10-325 MG TABLET    Take 1 tablet by mouth every 4 (four) hours as needed for pain.   TELMISARTAN-HYDROCHLOROTHIAZIDE (MICARDIS HCT) 40-12.5 MG TABLET    Take 1 tablet by mouth daily.  Modified Medications   No medications on file  Discontinued Medications   AMOXICILLIN -CLAVULANATE (AUGMENTIN ) 875-125 MG TABLET    SMARTSIG:1 Tablet(s) By Mouth Every 12 Hours   NYSTATIN (MYCOSTATIN) 100000 UNIT/ML SUSPENSION    Take 5 mLs by mouth 4 (four) times daily.   ONDANSETRON  (ZOFRAN ) 4 MG TABLET    Take 1 tablet (4 mg total) by mouth every 8 (eight) hours as needed for nausea or vomiting.    Past Medical History Past Medical History:  Diagnosis Date   Hypertension    Thyroid  disease     Past Surgical History Past Surgical History:  Procedure Laterality Date   radioactive iodine     TOOTH EXTRACTION  09/27/2012   Procedure: EXTRACTION MOLARS;  Surgeon: Lonni LITTIE Sax, DDS;  Location: [facility] OR;  Service: Oral Surgery;  Laterality: Right;  INTRAORAL, EXTRAORAL,,  IRRIGATION AND DEBRIDEMENT SURGICAL REMOVAL # 30     Family History family history includes Breast cancer in her maternal grandmother; Breast cancer (age of onset: 77) in her mother; CAD in her father; Hypertension in her father and mother.  Social History Social History   Socioeconomic History   Marital status: Married    Spouse name: Not on file   Number of children: Not on file   Years of education: Not on file   Highest education level: Not on file  Occupational History   Not on file  Tobacco Use   Smoking status: Never   Smokeless tobacco: Never  Substance and Sexual Activity   Alcohol use: Yes    Comment: occ wine   Drug use: No   Sexual activity: Yes    Birth control/protection: None  Other Topics Concern   Not on file  Social History Narrative   Not on file   Social Drivers of Health   Financial Resource Strain: Not on file  Food Insecurity: Not on file  Transportation  Needs: Not on file  Physical Activity: Not on file  Stress: Not on file  Social Connections: Not on file  Intimate Partner Violence: Not on file    Laboratory Investigations Lab Results  Component Value Date   TSH 0.46 08/09/2022   TSH 10.07 (H) 05/09/2022   TSH 1.49 10/29/2020   FREET4 1.21 08/09/2022   FREET4 0.90 05/09/2022   FREET4 1.24 10/29/2020     No results found for: TSI   No components found for: TRAB   Lab Results  Component Value Date   CHOL 255 (H) 04/07/2019   Lab Results  Component Value Date   HDL 69.60 04/07/2019   Lab Results  Component Value Date   LDLCALC 154 (H) 04/07/2019   Lab Results  Component Value Date   TRIG 160.0 (H) 04/07/2019   Lab Results  Component Value Date   CHOLHDL 4 04/07/2019   Lab Results  Component Value Date   CREATININE 1.0 09/19/2013   Lab Results  Component Value Date   GFR 66.41 09/19/2013      Component Value Date/Time   NA 137 09/19/2013 1550   K 3.7 09/19/2013 1550   CL 105 09/19/2013 1550   CO2 26 09/19/2013 1550   GLUCOSE 101 (H) 09/19/2013 1550  BUN 14 09/19/2013 1550   CREATININE 1.0 09/19/2013 1550   CALCIUM 9.0 09/19/2013 1550   PROT 7.8 09/25/2012 1752   ALBUMIN 4.3 09/25/2012 1752   AST 16 09/25/2012 1752   ALT 10 09/25/2012 1752   ALKPHOS 97 09/25/2012 1752   BILITOT 0.5 09/25/2012 1752   GFRNONAA >90 09/27/2012 0625   GFRAA >90 09/27/2012 0625      Latest Ref Rng & Units 09/19/2013    3:50 PM 03/10/2013   10:00 AM 09/27/2012    6:25 AM  BMP  Glucose 70 - 99 mg/dL 898  71  886   BUN 6 - 23 mg/dL 14  15  3    Creatinine 0.4 - 1.2 mg/dL 1.0  0.8  9.45   Sodium 135 - 145 mEq/L 137  139  139   Potassium 3.5 - 5.1 mEq/L 3.7  4.5  3.6   Chloride 96 - 112 mEq/L 105  105  103   CO2 19 - 32 mEq/L 26  30  27    Calcium 8.4 - 10.5 mg/dL 9.0  9.8  8.1        Component Value Date/Time   WBC 8.5 09/28/2012 0550   RBC 3.68 (L) 09/28/2012 0550   HGB 10.9 (L) 09/28/2012 0550   HCT 32.9 (L)  09/28/2012 0550   PLT 287 09/28/2012 0550   MCV 89.4 09/28/2012 0550   MCH 29.6 09/28/2012 0550   MCHC 33.1 09/28/2012 0550   RDW 12.5 09/28/2012 0550   LYMPHSABS 2.0 09/25/2012 1752   MONOABS 1.0 09/25/2012 1752   EOSABS 0.2 09/25/2012 1752   BASOSABS 0.1 09/25/2012 1752      Parts of this note may have been dictated using voice recognition software. There may be variances in spelling and vocabulary which are unintentional. Not all errors are proofread. Please notify the dino if any discrepancies are noted or if the meaning of any statement is not clear.

## 2024-07-09 LAB — TSH+FREE T4: TSH W/REFLEX TO FT4: 3.03 m[IU]/L

## 2024-07-23 ENCOUNTER — Telehealth: Admitting: "Endocrinology

## 2024-08-18 ENCOUNTER — Other Ambulatory Visit: Payer: Self-pay | Admitting: Family Medicine

## 2024-08-18 ENCOUNTER — Other Ambulatory Visit: Payer: Self-pay | Admitting: "Endocrinology

## 2024-08-18 DIAGNOSIS — E890 Postprocedural hypothyroidism: Secondary | ICD-10-CM

## 2024-08-18 DIAGNOSIS — R928 Other abnormal and inconclusive findings on diagnostic imaging of breast: Secondary | ICD-10-CM

## 2024-08-18 NOTE — Telephone Encounter (Signed)
Requested Prescriptions     Pending Prescriptions Disp Refills    levothyroxine (SYNTHROID) 150 MCG tablet [Pharmacy Med Name: LEVOTHYROXINE 150 MCG TABLET] 30 tablet 0     Sig: TAKE 1 TABLET BY MOUTH EVERY DAY

## 2024-08-21 ENCOUNTER — Other Ambulatory Visit: Payer: Self-pay | Admitting: Family Medicine

## 2024-08-21 DIAGNOSIS — N6320 Unspecified lump in the left breast, unspecified quadrant: Secondary | ICD-10-CM

## 2024-08-22 ENCOUNTER — Encounter: Payer: Self-pay | Admitting: "Endocrinology

## 2024-08-22 ENCOUNTER — Telehealth (INDEPENDENT_AMBULATORY_CARE_PROVIDER_SITE_OTHER): Admitting: "Endocrinology

## 2024-08-22 DIAGNOSIS — E890 Postprocedural hypothyroidism: Secondary | ICD-10-CM

## 2024-08-22 MED ORDER — LEVOTHYROXINE SODIUM 150 MCG PO TABS: 150.0000 ug | ORAL_TABLET | Freq: Every day | ORAL | 3 refills | Status: AC

## 2024-08-22 NOTE — Progress Notes (Addendum)
 "  The patient reports they are currently: Glenda Cardenas. I spent 3-4 minutes on the video with the patient on the date of service. I spent an additional 3 minutes on pre- and post-visit activities on the date of service.   The patient was physically located in [place]  or a state in which I am permitted to provide care. The patient and/or parent/guardian understood that s/he may incur co-pays and cost sharing, and agreed to the telemedicine visit. The visit was reasonable and appropriate under the circumstances given the patient's presentation at the time.  The patient and/or parent/guardian has been advised of the potential risks and limitations of this mode of treatment (including, but not limited to, the absence of in-person examination) and has agreed to be treated using telemedicine. The patient's/patient's family's questions regarding telemedicine have been answered.   The patient and/or parent/guardian has also been advised to contact their provider's office for worsening conditions, and seek emergency medical treatment and/or call 911 if the patient deems either necessary.     Outpatient Endocrinology Note Glenda Birmingham, MD  08/22/2024   Glenda Cardenas 10-10-1970 [id number]  Referring Provider: Gib Charleston, MD Primary Care Provider: Gib Charleston, MD Subjective  No chief complaint on file.   Assessment & Plan  Diagnoses and all orders for this visit:  Hypothyroidism following radioiodine therapy -     levothyroxine  (SYNTHROID ) 150 MCG tablet; Take 1 tablet (150 mcg total) by mouth daily. -     TSH + free T4    ZARIANNA DICARLO is currently taking levothyroxine  150 mcg po qd. Patient is currently biochemically euthyroid.  Educated on thyroid  axis.  Recommend the following: Take levothyroxine  150mcg every morning.  Advised to take levothyroxine  first thing in the morning on empty stomach and wait at least 30 minutes to 1 hour before eating or drinking anything or  taking any other medications. Space out levothyroxine  by 4 hours from any acid reflux medication/fibrate/iron/calcium/multivitamin. Advised to take birth control pills and nutritional supplements in the evening. Repeat lab before next visit or sooner if symptoms of hyperthyroidism or hypothyroidism develop.  Notify us  immediately in case of significant weight gain or loss. Counseled on compliance and follow up needs.  I have reviewed current medications, nurse's notes, allergies, vital signs, past medical and surgical history, family medical history, and social history for this encounter. Counseled patient on symptoms, examination findings, lab findings, imaging results, treatment decisions and monitoring and prognosis. The patient understood the recommendations and agrees with the treatment plan. All questions regarding treatment plan were fully answered.   Return in about 1 year (around 08/22/2025) for visit + labs before next visit.   Glenda Birmingham, MD  08/22/2024   I have reviewed current medications, nurse's notes, allergies, vital signs, past medical and surgical history, family medical history, and social history for this encounter. Counseled patient on symptoms, examination findings, lab findings, imaging results, treatment decisions and monitoring and prognosis. The patient understood the recommendations and agrees with the treatment plan. All questions regarding treatment plan were fully answered.   History of Present Illness Glenda Cardenas is a 53 y.o. year old female who presents to our clinic with postablative hypothyroidism since 2023.    Her hypothyroidism presented following RAI treatment for Graves' disease on 09/28/11. She used to see Dr. Von in the past.  Symptoms suggestive of HYPOTHYROIDISM:  Excess fatigue No weight gain No cold intolerance  No constipation  No  Symptoms suggestive of HYPERTHYROIDISM:  weight loss  No heat intolerance No hyperdefecation   No palpitations  No  Compressive symptoms:  dysphagia  No dysphonia  No positional dyspnea (especially with simultaneous arms elevation)  No  Smokes  No On biotin  No Personal history of head/neck surgery/irradiation  No  Physical Exam  Ht 5' 5 (1.651 m)   Wt 176 lb (79.8 kg)   LMP 09/04/2018   BMI 29.29 kg/m  Constitutional: well developed, well nourished Head: normocephalic, atraumatic, no exophthalmos Eyes: sclera anicteric, no redness Neck: no thyromegaly or thyroid  nodules insepcted Lungs: normal respiratory effort Neurology: alert and oriented, no fine hand tremor Skin: dry, no appreciable rashes Musculoskeletal: no appreciable defects Psychiatric: normal mood and affect  Allergies No Known Allergies  Current Medications Patient's Medications  New Prescriptions   No medications on file  Previous Medications   ESCITALOPRAM (LEXAPRO) 20 MG TABLET    Take 20 mg by mouth daily.   OXYCODONE -ACETAMINOPHEN  (PERCOCET) 10-325 MG TABLET    Take 1 tablet by mouth every 4 (four) hours as needed for pain.   TELMISARTAN-HYDROCHLOROTHIAZIDE (MICARDIS HCT) 40-12.5 MG TABLET    Take 1 tablet by mouth daily.  Modified Medications   Modified Medication Previous Medication   LEVOTHYROXINE  (SYNTHROID ) 150 MCG TABLET levothyroxine  (SYNTHROID ) 150 MCG tablet      Take 1 tablet (150 mcg total) by mouth daily.    TAKE 1 TABLET BY MOUTH EVERY DAY  Discontinued Medications   No medications on file    Past Medical History Past Medical History:  Diagnosis Date   Hypertension    Thyroid  disease     Past Surgical History Past Surgical History:  Procedure Laterality Date   radioactive iodine     TOOTH EXTRACTION  09/27/2012   Procedure: EXTRACTION MOLARS;  Surgeon: Lonni LITTIE Sax, DDS;  Location: [facility] OR;  Service: Oral Surgery;  Laterality: Right;  INTRAORAL, EXTRAORAL,,  IRRIGATION AND DEBRIDEMENT SURGICAL REMOVAL # 30     Family History family history includes Breast  cancer in her maternal grandmother; Breast cancer (age of onset: 4) in her mother; CAD in her father; Hypertension in her father and mother.  Social History Social History   Socioeconomic History   Marital status: Married    Spouse name: Not on file   Number of children: Not on file   Years of education: Not on file   Highest education level: Not on file  Occupational History   Not on file  Tobacco Use   Smoking status: Never   Smokeless tobacco: Never  Substance and Sexual Activity   Alcohol use: Yes    Comment: occ wine   Drug use: No   Sexual activity: Yes    Birth control/protection: None  Other Topics Concern   Not on file  Social History Narrative   Not on file   Social Drivers of Health   Tobacco Use: Low Risk (08/22/2024)   Patient History    Smoking Tobacco Use: Never    Smokeless Tobacco Use: Never    Passive Exposure: Not on file  Financial Resource Strain: Not on file  Food Insecurity: Not on file  Transportation Needs: Not on file  Physical Activity: Not on file  Stress: Not on file  Social Connections: Not on file  Intimate Partner Violence: Not on file  Depression (EYV7-0): Low Risk (07/08/2024)   Depression (PHQ2-9)    PHQ-2 Score: 0  Alcohol Screen: Not on file  Housing: Not on file  Utilities: Not on file  Health Literacy: Not  on file    Laboratory Investigations Lab Results  Component Value Date   TSH 0.46 08/09/2022   TSH 10.07 (H) 05/09/2022   TSH 1.49 10/29/2020   FREET4 1.21 08/09/2022   FREET4 0.90 05/09/2022   FREET4 1.24 10/29/2020     No results found for: TSI   No components found for: TRAB   Lab Results  Component Value Date   CHOL 255 (H) 04/07/2019   Lab Results  Component Value Date   HDL 69.60 04/07/2019   Lab Results  Component Value Date   LDLCALC 154 (H) 04/07/2019   Lab Results  Component Value Date   TRIG 160.0 (H) 04/07/2019   Lab Results  Component Value Date   CHOLHDL 4 04/07/2019   Lab  Results  Component Value Date   CREATININE 1.0 09/19/2013   Lab Results  Component Value Date   GFR 66.41 09/19/2013      Component Value Date/Time   NA 137 09/19/2013 1550   K 3.7 09/19/2013 1550   CL 105 09/19/2013 1550   CO2 26 09/19/2013 1550   GLUCOSE 101 (H) 09/19/2013 1550   Cardenas 14 09/19/2013 1550   CREATININE 1.0 09/19/2013 1550   CALCIUM 9.0 09/19/2013 1550   PROT 7.8 09/25/2012 1752   ALBUMIN 4.3 09/25/2012 1752   AST 16 09/25/2012 1752   ALT 10 09/25/2012 1752   ALKPHOS 97 09/25/2012 1752   BILITOT 0.5 09/25/2012 1752   GFRNONAA >90 09/27/2012 0625   GFRAA >90 09/27/2012 0625      Latest Ref Rng & Units 09/19/2013    3:50 PM 03/10/2013   10:00 AM 09/27/2012    6:25 AM  BMP  Glucose 70 - 99 mg/dL 898  71  886   Cardenas 6 - 23 mg/dL 14  15  3    Creatinine 0.4 - 1.2 mg/dL 1.0  0.8  9.45   Sodium 135 - 145 mEq/L 137  139  139   Potassium 3.5 - 5.1 mEq/L 3.7  4.5  3.6   Chloride 96 - 112 mEq/L 105  105  103   CO2 19 - 32 mEq/L 26  30  27    Calcium 8.4 - 10.5 mg/dL 9.0  9.8  8.1        Component Value Date/Time   WBC 8.5 09/28/2012 0550   RBC 3.68 (L) 09/28/2012 0550   HGB 10.9 (L) 09/28/2012 0550   HCT 32.9 (L) 09/28/2012 0550   PLT 287 09/28/2012 0550   MCV 89.4 09/28/2012 0550   MCH 29.6 09/28/2012 0550   MCHC 33.1 09/28/2012 0550   RDW 12.5 09/28/2012 0550   LYMPHSABS 2.0 09/25/2012 1752   MONOABS 1.0 09/25/2012 1752   EOSABS 0.2 09/25/2012 1752   BASOSABS 0.1 09/25/2012 1752      Parts of this note may have been dictated using voice recognition software. There may be variances in spelling and vocabulary which are unintentional. Not all errors are proofread. Please notify the dino if any discrepancies are noted or if the meaning of any statement is not clear.    "

## 2024-08-29 ENCOUNTER — Ambulatory Visit
Admission: RE | Admit: 2024-08-29 | Discharge: 2024-08-29 | Disposition: A | Discharge status: Home / Self Care | Source: Ambulatory Visit | Attending: Family Medicine | Admitting: Family Medicine

## 2024-08-29 DIAGNOSIS — N6320 Unspecified lump in the left breast, unspecified quadrant: Secondary | ICD-10-CM
